# Patient Record
Sex: Female | Born: 1970 | Race: White | Hispanic: No | Marital: Married | Smoking: Never smoker
Health system: Southern US, Community
[De-identification: ages and names within clinical notes are randomized; demographics above are authoritative.]

## PROBLEM LIST (undated history)

## (undated) DIAGNOSIS — G5600 Carpal tunnel syndrome, unspecified upper limb: Secondary | ICD-10-CM

## (undated) DIAGNOSIS — N809 Endometriosis, unspecified: Secondary | ICD-10-CM

## (undated) DIAGNOSIS — K219 Gastro-esophageal reflux disease without esophagitis: Secondary | ICD-10-CM

## (undated) DIAGNOSIS — K819 Cholecystitis, unspecified: Secondary | ICD-10-CM

## (undated) HISTORY — DX: Cholecystitis, unspecified: K81.9

## (undated) HISTORY — DX: Carpal tunnel syndrome, unspecified upper limb: G56.00

## (undated) HISTORY — PX: TUBAL LIGATION: SHX77

## (undated) HISTORY — DX: Endometriosis, unspecified: N80.9

## (undated) HISTORY — PX: DIAGNOSTIC LAPAROSCOPY: SUR761

---

## 2004-09-17 ENCOUNTER — Emergency Department: Payer: Self-pay | Admitting: Emergency Medicine

## 2004-09-18 ENCOUNTER — Ambulatory Visit: Payer: Self-pay | Admitting: Emergency Medicine

## 2005-05-13 ENCOUNTER — Ambulatory Visit: Payer: Self-pay

## 2008-03-30 ENCOUNTER — Ambulatory Visit: Payer: Self-pay

## 2009-06-01 ENCOUNTER — Ambulatory Visit: Payer: Self-pay

## 2009-10-12 ENCOUNTER — Ambulatory Visit: Payer: Self-pay | Admitting: Internal Medicine

## 2009-10-19 ENCOUNTER — Ambulatory Visit: Payer: Self-pay | Admitting: Internal Medicine

## 2010-06-07 ENCOUNTER — Ambulatory Visit: Payer: Self-pay

## 2012-10-12 ENCOUNTER — Ambulatory Visit: Payer: Self-pay | Admitting: Otolaryngology

## 2012-12-16 ENCOUNTER — Ambulatory Visit: Payer: Self-pay | Admitting: Otolaryngology

## 2014-05-05 NOTE — Op Note (Signed)
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   PROCEDURE:  1.  Septoplasty.  2.  Bilateral submucous resection of the inferior turbinates.   SURGEON: Janalee Dane, MD.  ANESTHESIA: General.  FINDINGS: The septum was buckled to the left side of reverse J type deformity extending posteriorly to the junction between the perpendicular plate of the ethmoid and the vomer.  DESCRIPTION OF PROCEDURE: The patient was identified in the holding area and was brought back to the operating room in the supine position on the operating room table.  After general endotracheal anesthesia had been induced the patient was turned 90 degrees counter clockwise from anesthesia.  The nose was anesthetized with infraorbital nerve blocks and septal injection with 0.5% Lidocaine and 0.25% Bupivacaine mixed with 1:150,000 with Epinephrine and phenylephrine Lidocaine soaked pledgets, two on each side were placed and the face was prepped and draped in the usual fashion.  The pledgets were removed.  A 15 blade was used to make a left-sided hemitransfixion incision and septal mucoperichondrial mucoperiosteal leaflets elevated.  There was a large inferior spur that was resected with Jansen-Middleton forceps.  The remaining septum was deviated back and forth in an accordion like fashion.  The bony cartilaginous junction was then divided and a moderate amount of vomer and perpendicular plate was taken down with Jansen-Middleton forceps, releasing the tension on the remaining septum.  The septum then swung back into the midline.  The septal leaflets were closed with quilting 4-0 chromic suture.  The left sided hemitransfixion incision was  closed with 4-0 plain gut.  Attention was directed to the turbinates which had been previously injected on the left.  The head of the inferior turbinate on the left was incised with a 15 blade and the medial mucoperiosteum was elevated using a Psychologist, educational.  Once this had been elevated Knight scissors were used to resect the conchal bone and lateral mucoperiosteum.  The inferior margin of the remaining mucoperiosteum was then cauterized with suction cautery and Surgiflo was placed at the inferior to the inferior margin of the remaining inferior turbinate.  An identical procedure was performed on the right inferior turbinate with once again placement of Surgiflo along its inferior margin.  A total of 1 unit of Surgiflo was placed. Temporary Telfa pledgets were then placed.  The patient was allowed to emerge from anesthesia, extubated in the operating room and taken to the recovery room in stable condition.  There were no complications.  Estimated blood loss was less than 10 mL.   ____________________________ J. Nadeen Landau, MD jmc:aw D: 12/16/2012 10:10:19 ET T: 12/16/2012 10:31:32 ET JOB#: [id number]  cc: Janalee Dane, MD, <Dictator> Nicholos Johns MD ELECTRONICALLY SIGNED 12/17/2012 13:42

## 2014-10-03 DIAGNOSIS — G5602 Carpal tunnel syndrome, left upper limb: Secondary | ICD-10-CM | POA: Insufficient documentation

## 2015-05-30 ENCOUNTER — Other Ambulatory Visit: Payer: Self-pay | Admitting: Obstetrics and Gynecology

## 2015-05-30 DIAGNOSIS — Z1231 Encounter for screening mammogram for malignant neoplasm of breast: Secondary | ICD-10-CM

## 2015-07-05 ENCOUNTER — Ambulatory Visit
Admission: RE | Admit: 2015-07-05 | Discharge: 2015-07-05 | Disposition: A | Discharge status: Home / Self Care | Payer: BLUE CROSS/BLUE SHIELD | Source: Ambulatory Visit | Attending: Obstetrics and Gynecology | Admitting: Obstetrics and Gynecology

## 2015-07-05 ENCOUNTER — Other Ambulatory Visit: Payer: Self-pay | Admitting: Obstetrics and Gynecology

## 2015-07-05 DIAGNOSIS — Z1231 Encounter for screening mammogram for malignant neoplasm of breast: Secondary | ICD-10-CM | POA: Diagnosis not present

## 2015-07-06 DIAGNOSIS — Z01419 Encounter for gynecological examination (general) (routine) without abnormal findings: Secondary | ICD-10-CM | POA: Diagnosis not present

## 2015-12-12 DIAGNOSIS — J019 Acute sinusitis, unspecified: Secondary | ICD-10-CM | POA: Diagnosis not present

## 2016-02-27 ENCOUNTER — Observation Stay: Payer: BLUE CROSS/BLUE SHIELD | Admitting: Anesthesiology

## 2016-02-27 ENCOUNTER — Observation Stay
Admission: EM | Admit: 2016-02-27 | Discharge: 2016-02-28 | Disposition: A | Discharge status: Home / Self Care | Payer: BLUE CROSS/BLUE SHIELD | Attending: Surgery | Admitting: Surgery

## 2016-02-27 ENCOUNTER — Emergency Department: Payer: BLUE CROSS/BLUE SHIELD

## 2016-02-27 ENCOUNTER — Encounter
Admission: EM | Disposition: A | Discharge status: Home / Self Care | Payer: Self-pay | Source: Home / Self Care | Attending: Emergency Medicine

## 2016-02-27 DIAGNOSIS — R109 Unspecified abdominal pain: Secondary | ICD-10-CM

## 2016-02-27 DIAGNOSIS — K824 Cholesterolosis of gallbladder: Secondary | ICD-10-CM | POA: Diagnosis not present

## 2016-02-27 DIAGNOSIS — K8010 Calculus of gallbladder with chronic cholecystitis without obstruction: Principal | ICD-10-CM | POA: Insufficient documentation

## 2016-02-27 DIAGNOSIS — K819 Cholecystitis, unspecified: Secondary | ICD-10-CM | POA: Diagnosis not present

## 2016-02-27 DIAGNOSIS — K8020 Calculus of gallbladder without cholecystitis without obstruction: Secondary | ICD-10-CM | POA: Diagnosis not present

## 2016-02-27 DIAGNOSIS — R05 Cough: Secondary | ICD-10-CM | POA: Diagnosis not present

## 2016-02-27 DIAGNOSIS — R079 Chest pain, unspecified: Secondary | ICD-10-CM | POA: Diagnosis not present

## 2016-02-27 DIAGNOSIS — K810 Acute cholecystitis: Secondary | ICD-10-CM | POA: Diagnosis not present

## 2016-02-27 DIAGNOSIS — R1011 Right upper quadrant pain: Secondary | ICD-10-CM | POA: Diagnosis not present

## 2016-02-27 HISTORY — PX: CHOLECYSTECTOMY: SHX55

## 2016-02-27 LAB — COMPREHENSIVE METABOLIC PANEL
ALBUMIN: 4.1 g/dL (ref 3.5–5.0)
ALT: 11 U/L — AB (ref 14–54)
AST: 18 U/L (ref 15–41)
Alkaline Phosphatase: 69 U/L (ref 38–126)
Anion gap: 8 (ref 5–15)
BUN: 10 mg/dL (ref 6–20)
CHLORIDE: 112 mmol/L — AB (ref 101–111)
CO2: 21 mmol/L — AB (ref 22–32)
CREATININE: 1.25 mg/dL — AB (ref 0.44–1.00)
Calcium: 8.5 mg/dL — ABNORMAL LOW (ref 8.9–10.3)
GFR calc Af Amer: 58 mL/min — ABNORMAL LOW (ref >60)
GFR, EST NON AFRICAN AMERICAN: 50 mL/min — AB (ref >60)
Glucose, Bld: 123 mg/dL — ABNORMAL HIGH (ref 65–99)
POTASSIUM: 4.1 mmol/L (ref 3.5–5.1)
SODIUM: 141 mmol/L (ref 135–145)
Total Bilirubin: 0.4 mg/dL (ref 0.3–1.2)
Total Protein: 7.3 g/dL (ref 6.5–8.1)

## 2016-02-27 LAB — URINALYSIS, COMPLETE (UACMP) WITH MICROSCOPIC
BILIRUBIN URINE: NEGATIVE
Bacteria, UA: NONE SEEN
GLUCOSE, UA: NEGATIVE mg/dL
KETONES UR: 5 mg/dL — AB
LEUKOCYTES UA: NEGATIVE
Nitrite: NEGATIVE
PH: 5 (ref 5.0–8.0)
PROTEIN: NEGATIVE mg/dL
Specific Gravity, Urine: 1.023 (ref 1.005–1.030)

## 2016-02-27 LAB — CBC
HCT: 37.8 % (ref 35.0–47.0)
Hemoglobin: 13.1 g/dL (ref 12.0–16.0)
MCH: 32.5 pg (ref 26.0–34.0)
MCHC: 34.6 g/dL (ref 32.0–36.0)
MCV: 93.8 fL (ref 80.0–100.0)
PLATELETS: 195 10*3/uL (ref 150–440)
RBC: 4.03 MIL/uL (ref 3.80–5.20)
RDW: 13 % (ref 11.5–14.5)
WBC: 10.7 10*3/uL (ref 3.6–11.0)

## 2016-02-27 LAB — TROPONIN I

## 2016-02-27 LAB — POCT PREGNANCY, URINE: PREG TEST UR: NEGATIVE

## 2016-02-27 LAB — LIPASE, BLOOD: Lipase: 30 U/L (ref 11–51)

## 2016-02-27 LAB — BILIRUBIN, DIRECT: Bilirubin, Direct: <0.1 mg/dL — ABNORMAL LOW (ref 0.1–0.5)

## 2016-02-27 SURGERY — LAPAROSCOPIC CHOLECYSTECTOMY
Anesthesia: General | Site: Abdomen | Wound class: Contaminated

## 2016-02-27 MED ORDER — MIDAZOLAM HCL 2 MG/2ML IJ SOLN
INTRAMUSCULAR | Status: DC | PRN
  Administered 2016-02-27: 2 mg via INTRAVENOUS

## 2016-02-27 MED ORDER — ONDANSETRON HCL 4 MG/2ML IJ SOLN
INTRAMUSCULAR | Status: AC
  Filled 2016-02-27: qty 2

## 2016-02-27 MED ORDER — PROPOFOL 10 MG/ML IV BOLUS
INTRAVENOUS | Status: AC
  Filled 2016-02-27: qty 20

## 2016-02-27 MED ORDER — OXYCODONE HCL 5 MG/5ML PO SOLN: 5.0000 mg | Freq: Once | ORAL | Status: DC | PRN

## 2016-02-27 MED ORDER — ENOXAPARIN SODIUM 40 MG/0.4ML ~~LOC~~ SOLN
40.0000 mg | SUBCUTANEOUS | Status: DC
  Administered 2016-02-28: 40 mg via SUBCUTANEOUS
  Filled 2016-02-27: qty 0.4

## 2016-02-27 MED ORDER — DEXTROSE 5 % IV SOLN: 1.0000 g | Freq: Once | INTRAVENOUS | Status: DC

## 2016-02-27 MED ORDER — OXYCODONE HCL 5 MG PO TABS: 5.0000 mg | ORAL_TABLET | Freq: Once | ORAL | Status: DC | PRN

## 2016-02-27 MED ORDER — LIDOCAINE HCL (PF) 2 % IJ SOLN
INTRAMUSCULAR | Status: AC
  Filled 2016-02-27: qty 2

## 2016-02-27 MED ORDER — MIDAZOLAM HCL 2 MG/2ML IJ SOLN
INTRAMUSCULAR | Status: AC
  Filled 2016-02-27: qty 2

## 2016-02-27 MED ORDER — PROPOFOL 10 MG/ML IV BOLUS
INTRAVENOUS | Status: DC | PRN
  Administered 2016-02-27: 160 mg via INTRAVENOUS

## 2016-02-27 MED ORDER — ONDANSETRON 4 MG PO TBDP: 4.0000 mg | ORAL_TABLET | Freq: Four times a day (QID) | ORAL | Status: DC | PRN

## 2016-02-27 MED ORDER — SODIUM CHLORIDE 0.9 % IV BOLUS (SEPSIS)
1000.0000 mL | Freq: Once | INTRAVENOUS | Status: AC
  Administered 2016-02-27: 1000 mL via INTRAVENOUS

## 2016-02-27 MED ORDER — HYDROMORPHONE HCL 1 MG/ML IJ SOLN: 0.5000 mg | INTRAMUSCULAR | Status: DC | PRN

## 2016-02-27 MED ORDER — OXYCODONE-ACETAMINOPHEN 7.5-325 MG PO TABS
2.0000 | ORAL_TABLET | ORAL | Status: DC | PRN
Start: 2016-02-27 — End: 2016-02-28
  Administered 2016-02-27 – 2016-02-28 (×2): 2 via ORAL
  Filled 2016-02-27 (×2): qty 2

## 2016-02-27 MED ORDER — FENTANYL CITRATE (PF) 100 MCG/2ML IJ SOLN
INTRAMUSCULAR | Status: AC
  Administered 2016-02-27: 50 ug via INTRAVENOUS
  Filled 2016-02-27: qty 2

## 2016-02-27 MED ORDER — ROCURONIUM BROMIDE 50 MG/5ML IV SOLN
INTRAVENOUS | Status: AC
  Filled 2016-02-27: qty 1

## 2016-02-27 MED ORDER — ONDANSETRON HCL 4 MG/2ML IJ SOLN
4.0000 mg | Freq: Once | INTRAMUSCULAR | Status: AC
  Administered 2016-02-27: 4 mg via INTRAVENOUS
  Filled 2016-02-27: qty 2

## 2016-02-27 MED ORDER — FENTANYL CITRATE (PF) 100 MCG/2ML IJ SOLN
INTRAMUSCULAR | Status: DC | PRN
  Administered 2016-02-27 (×4): 25 ug via INTRAVENOUS

## 2016-02-27 MED ORDER — CEFTRIAXONE SODIUM-DEXTROSE 1-3.74 GM-% IV SOLR
1.0000 g | Freq: Once | INTRAVENOUS | Status: AC
  Administered 2016-02-27: 1 g via INTRAVENOUS
  Filled 2016-02-27: qty 50

## 2016-02-27 MED ORDER — FENTANYL CITRATE (PF) 100 MCG/2ML IJ SOLN
INTRAMUSCULAR | Status: AC
  Filled 2016-02-27: qty 2

## 2016-02-27 MED ORDER — PIPERACILLIN-TAZOBACTAM 3.375 G IVPB
3.3750 g | Freq: Three times a day (TID) | INTRAVENOUS | Status: DC
  Administered 2016-02-27 – 2016-02-28 (×3): 3.375 g via INTRAVENOUS
  Filled 2016-02-27 (×2): qty 50

## 2016-02-27 MED ORDER — FENTANYL CITRATE (PF) 100 MCG/2ML IJ SOLN
25.0000 ug | INTRAMUSCULAR | Status: DC | PRN
  Administered 2016-02-27: 50 ug via INTRAVENOUS

## 2016-02-27 MED ORDER — ACETAMINOPHEN 10 MG/ML IV SOLN
INTRAVENOUS | Status: DC | PRN
  Administered 2016-02-27: 1000 mg via INTRAVENOUS

## 2016-02-27 MED ORDER — SUGAMMADEX SODIUM 200 MG/2ML IV SOLN
INTRAVENOUS | Status: AC
  Filled 2016-02-27: qty 2

## 2016-02-27 MED ORDER — ONDANSETRON HCL 4 MG/2ML IJ SOLN
INTRAMUSCULAR | Status: DC | PRN
  Administered 2016-02-27: 4 mg via INTRAVENOUS

## 2016-02-27 MED ORDER — PROMETHAZINE HCL 25 MG/ML IJ SOLN: 6.2500 mg | INTRAMUSCULAR | Status: DC | PRN

## 2016-02-27 MED ORDER — BUPIVACAINE-EPINEPHRINE 0.25% -1:200000 IJ SOLN
INTRAMUSCULAR | Status: DC | PRN
  Administered 2016-02-27: 30 mL

## 2016-02-27 MED ORDER — MORPHINE SULFATE (PF) 4 MG/ML IV SOLN
4.0000 mg | Freq: Once | INTRAVENOUS | Status: AC
  Administered 2016-02-27: 4 mg via INTRAVENOUS
  Filled 2016-02-27: qty 1

## 2016-02-27 MED ORDER — MEPERIDINE HCL 25 MG/ML IJ SOLN
25.0000 mg | Freq: Once | INTRAMUSCULAR | Status: AC
  Administered 2016-02-27: 25 mg via INTRAVENOUS

## 2016-02-27 MED ORDER — LIDOCAINE HCL (CARDIAC) 20 MG/ML IV SOLN
INTRAVENOUS | Status: DC | PRN
  Administered 2016-02-27: 60 mg via INTRAVENOUS

## 2016-02-27 MED ORDER — MEPERIDINE HCL 25 MG/ML IJ SOLN
INTRAMUSCULAR | Status: AC
  Filled 2016-02-27: qty 1

## 2016-02-27 MED ORDER — MEPERIDINE HCL 25 MG/ML IJ SOLN: 6.2500 mg | INTRAMUSCULAR | Status: DC | PRN

## 2016-02-27 MED ORDER — LACTATED RINGERS IV SOLN
INTRAVENOUS | Status: DC
  Administered 2016-02-27 – 2016-02-28 (×3): via INTRAVENOUS

## 2016-02-27 MED ORDER — BUPIVACAINE-EPINEPHRINE (PF) 0.25% -1:200000 IJ SOLN
INTRAMUSCULAR | Status: AC
  Filled 2016-02-27: qty 30

## 2016-02-27 MED ORDER — SUGAMMADEX SODIUM 200 MG/2ML IV SOLN
INTRAVENOUS | Status: DC | PRN
  Administered 2016-02-27: 200 mg via INTRAVENOUS

## 2016-02-27 MED ORDER — PIPERACILLIN-TAZOBACTAM 3.375 G IVPB
INTRAVENOUS | Status: AC
  Filled 2016-02-27: qty 50

## 2016-02-27 MED ORDER — ROCURONIUM BROMIDE 100 MG/10ML IV SOLN
INTRAVENOUS | Status: DC | PRN
  Administered 2016-02-27: 40 mg via INTRAVENOUS

## 2016-02-27 MED ORDER — ONDANSETRON HCL 4 MG/2ML IJ SOLN: 4.0000 mg | Freq: Four times a day (QID) | INTRAMUSCULAR | Status: DC | PRN

## 2016-02-27 MED ORDER — KETOROLAC TROMETHAMINE 30 MG/ML IJ SOLN
30.0000 mg | Freq: Four times a day (QID) | INTRAMUSCULAR | Status: DC
  Administered 2016-02-27 – 2016-02-28 (×4): 30 mg via INTRAVENOUS
  Filled 2016-02-27 (×4): qty 1

## 2016-02-27 MED ORDER — ACETAMINOPHEN 10 MG/ML IV SOLN
INTRAVENOUS | Status: AC
  Filled 2016-02-27: qty 100

## 2016-02-27 MED ORDER — KETOROLAC TROMETHAMINE 30 MG/ML IJ SOLN: 30.0000 mg | Freq: Four times a day (QID) | INTRAMUSCULAR | Status: DC | PRN

## 2016-02-27 SURGICAL SUPPLY — 46 items
APPLICATOR COTTON TIP 6IN STRL (MISCELLANEOUS) ×2 IMPLANT
APPLIER CLIP 5 13 M/L LIGAMAX5 (MISCELLANEOUS) ×2
BLADE SURG 15 STRL LF DISP TIS (BLADE) ×1 IMPLANT
BLADE SURG 15 STRL SS (BLADE) ×1
CANISTER SUCT 1200ML W/VALVE (MISCELLANEOUS) ×2 IMPLANT
CHLORAPREP W/TINT 26ML (MISCELLANEOUS) ×2 IMPLANT
CHOLANGIOGRAM CATH TAUT (CATHETERS) IMPLANT
CLEANER CAUTERY TIP 5X5 PAD (MISCELLANEOUS) ×1 IMPLANT
CLIP APPLIE 5 13 M/L LIGAMAX5 (MISCELLANEOUS) ×1 IMPLANT
DECANTER SPIKE VIAL GLASS SM (MISCELLANEOUS) ×4 IMPLANT
DEVICE TROCAR PUNCTURE CLOSURE (ENDOMECHANICALS) IMPLANT
DRAPE C-ARM XRAY 36X54 (DRAPES) ×2 IMPLANT
ELECT CAUTERY BLADE 6.4 (BLADE) ×2 IMPLANT
ELECT REM PT RETURN 9FT ADLT (ELECTROSURGICAL) ×2
ELECTRODE REM PT RTRN 9FT ADLT (ELECTROSURGICAL) ×1 IMPLANT
ENDOPOUCH RETRIEVER 10 (MISCELLANEOUS) ×2 IMPLANT
GLOVE BIO SURGEON STRL SZ7 (GLOVE) ×2 IMPLANT
GOWN STRL REUS W/ TWL LRG LVL3 (GOWN DISPOSABLE) ×3 IMPLANT
GOWN STRL REUS W/TWL LRG LVL3 (GOWN DISPOSABLE) ×3
IRRIGATION STRYKERFLOW (MISCELLANEOUS) ×1 IMPLANT
IRRIGATOR STRYKERFLOW (MISCELLANEOUS) ×2
IV CATH ANGIO 12GX3 LT BLUE (NEEDLE) ×2 IMPLANT
IV NS 1000ML (IV SOLUTION) ×1
IV NS 1000ML BAXH (IV SOLUTION) ×1 IMPLANT
L-HOOK LAP DISP 36CM (ELECTROSURGICAL) ×2
LHOOK LAP DISP 36CM (ELECTROSURGICAL) ×1 IMPLANT
LIQUID BAND (GAUZE/BANDAGES/DRESSINGS) ×2 IMPLANT
NEEDLE HYPO 22GX1.5 SAFETY (NEEDLE) ×2 IMPLANT
PACK LAP CHOLECYSTECTOMY (MISCELLANEOUS) ×2 IMPLANT
PAD CLEANER CAUTERY TIP 5X5 (MISCELLANEOUS) ×1
PENCIL ELECTRO HAND CTR (MISCELLANEOUS) ×2 IMPLANT
SCISSORS METZENBAUM CVD 33 (INSTRUMENTS) ×2 IMPLANT
SLEEVE ENDOPATH XCEL 5M (ENDOMECHANICALS) ×4 IMPLANT
SOL ANTI-FOG 6CC FOG-OUT (MISCELLANEOUS) ×1 IMPLANT
SOL FOG-OUT ANTI-FOG 6CC (MISCELLANEOUS) ×1
SPONGE LAP 18X18 5 PK (GAUZE/BANDAGES/DRESSINGS) ×2 IMPLANT
STOPCOCK 3 WAY  REPLAC (MISCELLANEOUS) IMPLANT
SUT ETHIBOND 0 MO6 C/R (SUTURE) IMPLANT
SUT MNCRL AB 4-0 PS2 18 (SUTURE) ×2 IMPLANT
SUT VIC AB 0 CT2 27 (SUTURE) IMPLANT
SUT VICRYL 0 AB UR-6 (SUTURE) ×4 IMPLANT
SYR 20CC LL (SYRINGE) ×2 IMPLANT
TROCAR XCEL BLUNT TIP 100MML (ENDOMECHANICALS) ×2 IMPLANT
TROCAR XCEL NON-BLD 5MMX100MML (ENDOMECHANICALS) ×2 IMPLANT
TUBING INSUFFLATOR HI FLOW (MISCELLANEOUS) ×2 IMPLANT
WATER STERILE IRR 1000ML POUR (IV SOLUTION) ×2 IMPLANT

## 2016-02-27 NOTE — Anesthesia Preprocedure Evaluation (Signed)
Anesthesia Evaluation  Patient identified by MRN, date of birth, ID band Patient awake    Reviewed: Allergy & Precautions, NPO status , Patient's Chart, lab work & pertinent test results  Airway Mallampati: III  TM Distance: >3 FB     Dental  (+) Chipped   Pulmonary neg pulmonary ROS,    Pulmonary exam normal        Cardiovascular negative cardio ROS Normal cardiovascular exam     Neuro/Psych negative neurological ROS  negative psych ROS   GI/Hepatic Neg liver ROS,   Endo/Other  negative endocrine ROS  Renal/GU negative Renal ROS  negative genitourinary   Musculoskeletal negative musculoskeletal ROS (+)   Abdominal   Peds negative pediatric ROS (+)  Hematology negative hematology ROS (+)   Anesthesia Other Findings History reviewed. No pertinent past medical history.  Acute cholecystitis  Reproductive/Obstetrics negative OB ROS                            Anesthesia Physical Anesthesia Plan  ASA: II  Anesthesia Plan: General   Post-op Pain Management:    Induction: Intravenous  Airway Management Planned: Oral ETT  Additional Equipment:   Intra-op Plan:   Post-operative Plan: Extubation in OR  Informed Consent: I have reviewed the patients History and Physical, chart, labs and discussed the procedure including the risks, benefits and alternatives for the proposed anesthesia with the patient or authorized representative who has indicated his/her understanding and acceptance.   Dental advisory given  Plan Discussed with: CRNA and Surgeon  Anesthesia Plan Comments:         Anesthesia Quick Evaluation

## 2016-02-27 NOTE — Anesthesia Post-op Follow-up Note (Cosign Needed)
Anesthesia QCDR form completed.        

## 2016-02-27 NOTE — Anesthesia Postprocedure Evaluation (Signed)
Anesthesia Post Note  Patient: Tracey Watkins  Procedure(s) Performed: Procedure(s) (LRB): LAPAROSCOPIC CHOLECYSTECTOMY (N/A)  Patient location during evaluation: PACU Anesthesia Type: General Level of consciousness: awake and alert and oriented Pain management: pain level controlled Vital Signs Assessment: post-procedure vital signs reviewed and stable Respiratory status: spontaneous breathing, nonlabored ventilation and respiratory function stable Cardiovascular status: blood pressure returned to baseline and stable Postop Assessment: no signs of nausea or vomiting Anesthetic complications: no     Last Vitals:  Vitals:   02/27/16 1740 02/27/16 1847  BP: 118/74 116/62  Pulse: (!) 55 63  Resp: 16 14  Temp: 36.8 C 36.6 C    Last Pain:  Vitals:   02/27/16 1847  TempSrc: Oral  PainSc:                  Kaspar Albornoz

## 2016-02-27 NOTE — Transfer of Care (Signed)
Immediate Anesthesia Transfer of Care Note  Patient: Tracey Watkins  Procedure(s) Performed: Procedure(s): LAPAROSCOPIC CHOLECYSTECTOMY (N/A)  Patient Location: PACU  Anesthesia Type:General  Level of Consciousness: sedated  Airway & Oxygen Therapy: Patient Spontanous Breathing and Patient connected to face mask oxygen  Post-op Assessment: Report given to RN and Post -op Vital signs reviewed and stable  Post vital signs: Reviewed and stable  Last Vitals:  Vitals:   02/27/16 1340 02/27/16 1605  BP: (!) 108/93 130/80  Pulse: 60 (!) 102  Resp: 16 15  Temp: 37.1 C [id number] C    Complications: No apparent anesthesia complications

## 2016-02-27 NOTE — Progress Notes (Signed)
Patient ID: Tracey Watkins, female   DOB: October 14, 1970, 46 y.o.   MRN: [id number]  HPI Tracey Watkins is a 46 y.o. female presented with RUQ and Chest pain. Her pain is sharp severe and woke her from her sleep, it does radiate to her back and chest. She thought she was having an MI. Associated Emesis . She stated that her first sxs started on Monday night with only one episode of vomiting. Had a normal day yesterday until early this am. No fevers or chills. No evidence of biliary obstruction. W/U included RUQ U/S revealing thickening GB w stones. Nml CBD. Nml LFTs  HPI  History reviewed. No pertinent past medical history.  Past Surgical History:  Procedure Laterality Date  . TUBAL LIGATION      Family History  Problem Relation Age of Onset  . Breast cancer Paternal Grandmother 60    Social History Social History  Substance Use Topics  . Smoking status: Never Smoker  . Smokeless tobacco: Never Used  . Alcohol use No    No Known Allergies  Current Facility-Administered Medications  Medication Dose Route Frequency Provider Last Rate Last Dose  . [START ON 02/28/2016] enoxaparin (LOVENOX) injection 40 mg  40 mg Subcutaneous Q24H Diego F Pabon, MD      . HYDROmorphone (DILAUDID) injection 0.5 mg  0.5 mg Intravenous Q2H PRN Diego F Pabon, MD      . ketorolac (TORADOL) 30 MG/ML injection 30 mg  30 mg Intravenous Q6H Diego Sarita Haver, MD       Followed by  . [START ON 03/03/2016] ketorolac (TORADOL) 30 MG/ML injection 30 mg  30 mg Intravenous Q6H PRN Diego F Pabon, MD      . lactated ringers infusion   Intravenous Continuous Diego F Pabon, MD      . ondansetron (ZOFRAN-ODT) disintegrating tablet 4 mg  4 mg Oral Q6H PRN Diego F Pabon, MD       Or  . ondansetron (ZOFRAN) injection 4 mg  4 mg Intravenous Q6H PRN Diego F Pabon, MD      . piperacillin-tazobactam (ZOSYN) IVPB 3.375 g  3.375 g Intravenous Q8H Diego Sarita Haver, MD       Current Outpatient Prescriptions  Medication Sig Dispense Refill   . norethindrone (AYGESTIN) 5 MG tablet Take 5 mg by mouth daily.    . ranitidine (ZANTAC) 150 MG tablet Take 150 mg by mouth 2 (two) times daily.       Review of Systems A 10 point review of systems was asked and was negative except for the information on the HPI  Physical Exam Blood pressure 117/77, pulse 81, temperature 98.4 F (36.9 C), temperature source Oral, resp. rate 17, height 5\' 7"  (1.702 m), weight 89.4 kg (197 lb), SpO2 99 %. CONSTITUTIONAL: NAD EYES: Pupils are equal, round, and reactive to light, Sclera are non-icteric. EARS, NOSE, MOUTH AND THROAT: The oropharynx is clear. The oral mucosa is pink and moist. Hearing is intact to voice. LYMPH NODES:  Lymph nodes in the neck are normal. RESPIRATORY:  Lungs are clear. There is normal respiratory effort, with equal breath sounds bilaterally, and without pathologic use of accessory muscles. CARDIOVASCULAR: Heart is regular without murmurs, gallops, or rubs. GI: The abdomen is  Soft + murphy sign. Non palpable GB. GU: Rectal deferred.   MUSCULOSKELETAL: Normal muscle strength and tone. No cyanosis or edema.   SKIN: Turgor is good and there are no pathologic skin lesions or ulcers. NEUROLOGIC: Motor and sensation  is grossly normal. Cranial nerves are grossly intact. PSYCH:  Oriented to person, place and time. Affect is normal.  Data Reviewed   Assessment/Plan Acute cholecystitis in need for cholecystectomy. We will admit her, start crystalloids and antibiotics and perform chole later today.  I discussed the procedure in detail.   We discussed the risks and benefits of a laparoscopic cholecystectomy and possible cholangiogram including, but not limited to bleeding, infection, injury to surrounding structures such as the intestine or liver, bile leak, retained gallstones, need to convert to an open procedure, prolonged diarrhea, blood clots such as  DVT, common bile duct injury, anesthesia risks, and possible need for additional  procedures.  The likelihood of improvement in symptoms and return to the patient's normal status is good. We discussed the typical post-operative recovery course.   Caroleen Hamman, MD FACS General Surgeon 02/27/2016, 12:36 PM

## 2016-02-27 NOTE — H&P (Signed)
HPI Tracey Watkins is a 46 y.o. female presented with RUQ and Chest pain. Her pain is sharp severe and woke her from her sleep, it does radiate to her back and chest. She thought she was having an MI. Associated Emesis . She stated that her first sxs started on Monday night with only one episode of vomiting. Had a normal day yesterday until early this am. No fevers or chills. No evidence of biliary obstruction. W/U included RUQ U/S revealing thickening GB w stones. Nml CBD. Nml LFTs  HPI  History reviewed. No pertinent past medical history.       Past Surgical History:  Procedure Laterality Date  . TUBAL LIGATION           Family History  Problem Relation Age of Onset  . Breast cancer Paternal Grandmother 67    Social History     Social History  Substance Use Topics  . Smoking status: Never Smoker  . Smokeless tobacco: Never Used  . Alcohol use No    No Known Allergies           Current Facility-Administered Medications  Medication Dose Route Frequency Provider Last Rate Last Dose  . [START ON 02/28/2016] enoxaparin (LOVENOX) injection 40 mg  40 mg Subcutaneous Q24H Mauricio Dahlen F Lyndall Bellot, MD      . HYDROmorphone (DILAUDID) injection 0.5 mg  0.5 mg Intravenous Q2H PRN Kyndall Amero F Bryann Mcnealy, MD      . ketorolac (TORADOL) 30 MG/ML injection 30 mg  30 mg Intravenous Q6H [place]ilene Mcphee Sarita Haver, MD       Followed by  . [START ON 03/03/2016] ketorolac (TORADOL) 30 MG/ML injection 30 mg  30 mg Intravenous Q6H PRN Elton Heid F Timonthy Hovater, MD      . lactated ringers infusion   Intravenous Continuous Kandice Schmelter F Clemie General, MD      . ondansetron (ZOFRAN-ODT) disintegrating tablet 4 mg  4 mg Oral Q6H PRN Linwood Gullikson F Jsaon Yoo, MD       Or  . ondansetron (ZOFRAN) injection 4 mg  4 mg Intravenous Q6H PRN Farren Nelles F Ardene Remley, MD      . piperacillin-tazobactam (ZOSYN) IVPB 3.375 g  3.375 g Intravenous Q8H Delinda Malan Sarita Haver, MD             Current Outpatient Prescriptions  Medication Sig Dispense Refill  . norethindrone (AYGESTIN) 5  MG tablet Take 5 mg by mouth daily.    . ranitidine (ZANTAC) 150 MG tablet Take 150 mg by mouth 2 (two) times daily.       Review of Systems A 10 point review of systems was asked and was negative except for the information on the HPI  Physical Exam Blood pressure 117/77, pulse 81, temperature 98.4 F (36.9 C), temperature source Oral, resp. rate 17, height 5\' 7"  (1.702 m), weight 89.4 kg (197 lb), SpO2 99 %. CONSTITUTIONAL: NAD EYES: Pupils are equal, round, and reactive to light, Sclera are non-icteric. EARS, NOSE, MOUTH AND THROAT: The oropharynx is clear. The oral mucosa is pink and moist. Hearing is intact to voice. LYMPH NODES:  Lymph nodes in the neck are normal. RESPIRATORY:  Lungs are clear. There is normal respiratory effort, with equal breath sounds bilaterally, and without pathologic use of accessory muscles. CARDIOVASCULAR: Heart is regular without murmurs, gallops, or rubs. GI: The abdomen is  Soft + murphy sign. Non palpable GB. GU: Rectal deferred.   MUSCULOSKELETAL: Normal muscle strength and tone. No cyanosis or edema.   SKIN: Turgor is good and  there are no pathologic skin lesions or ulcers. NEUROLOGIC: Motor and sensation is grossly normal. Cranial nerves are grossly intact. PSYCH:  Oriented to person, place and time. Affect is normal.  Data Reviewed   Assessment/Plan Acute cholecystitis in need for cholecystectomy. We will admit her, start crystalloids and antibiotics and perform chole later today.  I discussed the procedure in detail.   We discussed the risks and benefits of a laparoscopic cholecystectomy and possible cholangiogram including, but not limited to bleeding, infection, injury to surrounding structures such as the intestine or liver, bile leak, retained gallstones, need to convert to an open procedure, prolonged diarrhea, blood clots such as  DVT, common bile duct injury, anesthesia risks, and possible need for additional procedures.  The  likelihood of improvement in symptoms and return to the patient's normal status is good. We discussed the typical post-operative recovery course.   Caroleen Hamman, MD FACS

## 2016-02-27 NOTE — ED Notes (Signed)
Attempted to call report to floor, RN unavailable at this time.  ASCOM # given and Rn will return my call.

## 2016-02-27 NOTE — OR Nursing (Signed)
Zosyn to OR with pt =- will start in OR per Charlyne Quale, RN

## 2016-02-27 NOTE — ED Notes (Signed)
Patient assisted to the restroom with no difficulty.  Steady ambulation at this time.

## 2016-02-27 NOTE — Op Note (Signed)
Laparoscopic Cholecystectomy  Pre-operative Diagnosis: Acute cholecystitis  Post-operative Diagnosis: same  Procedure: Lap cholecystectomy  Surgeon: Caroleen Hamman, MD FACS  Anesthesia: Gen. with endotracheal tube  Findings: Acute Cholecystitis  Friable liver bed due to inflammation  Estimated Blood Loss: 75cc                Specimens: Gallbladder           Complications: none   Procedure Details  The patient was seen again in the Holding Room. The benefits, complications, treatment options, and expected outcomes were discussed with the patient. The risks of bleeding, infection, recurrence of symptoms, failure to resolve symptoms, bile duct damage, bile duct leak, retained common bile duct stone, bowel injury, any of which could require further surgery and/or ERCP, stent, or papillotomy were reviewed with the patient. The likelihood of improving the patient's symptoms with return to their baseline status is good.  The patient and/or family concurred with the proposed plan, giving informed consent.  The patient was taken to Operating Room, identified as Tracey Watkins and the procedure verified as Laparoscopic Cholecystectomy.  A Time Out was held and the above information confirmed.  Prior to the induction of general anesthesia, antibiotic prophylaxis was administered. VTE prophylaxis was in place. General endotracheal anesthesia was then administered and tolerated well. After the induction, the abdomen was prepped with Chloraprep and draped in the sterile fashion. The patient was positioned in the supine position.  Local anesthetic  was injected into the skin near the umbilicus and an incision made. Cut down technique was used to enter the abdominal cavity and a Hasson trochar was placed after two vicryl stitches were anchored to the fascia. Pneumoperitoneum was then created with CO2 and tolerated well without any adverse changes in the patient's vital signs.  Three 5-mm ports were placed  in the right upper quadrant all under direct vision. All skin incisions  were infiltrated with a local anesthetic agent before making the incision and placing the trocars.   The patient was positioned  in reverse Trendelenburg, tilted slightly to the patient's left.  The gallbladder was identified, the fundus grasped and retracted cephalad. Adhesions were lysed bluntly. The infundibulum was grasped and retracted laterally, exposing the peritoneum overlying the triangle of Calot. This was then divided and exposed in a blunt fashion. An extended critical view of the cystic duct and cystic artery was obtained.  The cystic duct was clearly identified and bluntly dissected.   Artery and duct were double clipped and divided.  The gallbladder was taken from the gallbladder fossa in a retrograde fashion with the electrocautery. The gallbladder was removed and placed in an Endocatch bag. The liver bed was irrigated and inspected. Hemostasis was achieved with the electrocautery, liver bed was friable due to inflammation. Copious irrigation was utilized and was repeatedly aspirated until clear.  The gallbladder and Endocatch sac were then removed through the epigastric port site.   Inspection of the right upper quadrant was performed. No bleeding, bile duct injury or leak, or bowel injury was noted. Pneumoperitoneum was released.  The periumbilical port site was closed with figure-of-eight 0 Vicryl sutures. 4-0 subcuticular Monocryl was used to close the skin. Dermabond was  applied.  The patient was then extubated and brought to the recovery room in stable condition. Sponge, lap, and needle counts were correct at closure and at the conclusion of the case.               Caroleen Hamman, MD,  FACS

## 2016-02-27 NOTE — ED Provider Notes (Signed)
[facility] Emergency Department Provider Note   ____________________________________________   First MD Initiated Contact with Patient 02/27/16 0430     (approximate)  I have reviewed the triage vital signs and the nursing notes.   HISTORY  Chief Complaint Chest Pain    HPI Tracey Watkins is a 46 y.o. female who comes into the hospital today with some chest pain. The patient reports the pain started at 2 AM and woke her up out of sleep. It's right-sided pain underneath her breast. The pain radiates into her shoulder. The patient reports she's had some sweats and dizziness with some nausea and vomiting. She's never had this pain before and didn't take anything for pain. She reports that it does hurt when she's breathes in deeply. The patient rates her pain a 9 or 10 in intensity. She is here today for evaluation of this pain.   History reviewed. No pertinent past medical history.  There are no active problems to display for this patient.   Past Surgical History:  Procedure Laterality Date  . TUBAL LIGATION      Prior to Admission medications   Not on File    Allergies Patient has no known allergies.  Family History  Problem Relation Age of Onset  . Breast cancer Paternal Grandmother 36    Social History Social History  Substance Use Topics  . Smoking status: Never Smoker  . Smokeless tobacco: Never Used  . Alcohol use No    Review of Systems Constitutional: No fever/chills Eyes: No visual changes. ENT: No sore throat. Cardiovascular:  chest pain. Respiratory:  shortness of breath. Gastrointestinal: No abdominal pain.  No nausea, no vomiting.  No diarrhea.  No constipation. Genitourinary: Negative for dysuria. Musculoskeletal: Negative for back pain. Skin: Negative for rash. Neurological: Negative for headaches, focal weakness or numbness.  10-point ROS otherwise  negative.  ____________________________________________   PHYSICAL EXAM:  VITAL SIGNS: ED Triage Vitals  Enc Vitals Group     BP 02/27/16 0530 131/78     Pulse Rate 02/27/16 0530 (!) 50     Resp 02/27/16 0530 15     Temp --      Temp src --      SpO2 02/27/16 0530 97 %     Weight 02/27/16 0415 197 lb (89.4 kg)     Height 02/27/16 0415 5\' 7"  (1.702 m)     Head Circumference --      Peak Flow --      Pain Score 02/27/16 0415 9     Pain Loc --      Pain Edu? --      Excl. in [place]? --     Constitutional: Alert and oriented. Well appearing and in Moderate distress. Eyes: Conjunctivae are normal. PERRL. EOMI. Head: Atraumatic. Nose: No congestion/rhinnorhea. Mouth/Throat: Mucous membranes are moist.  Oropharynx non-erythematous. Cardiovascular: Normal rate, regular rhythm. Grossly normal heart sounds.  Good peripheral circulation. Respiratory: Normal respiratory effort.  No retractions. Lungs CTAB. Gastrointestinal: Soft with some right upper quadrant tenderness to palpation. No distention. Positive bowel sounds Musculoskeletal: No lower extremity tenderness nor edema.   Neurologic:  Normal speech and language. Skin:  Skin is warm, dry and intact. Psychiatric: Mood and affect are normal.   ____________________________________________   LABS (all labs ordered are listed, but only abnormal results are displayed)  Labs Reviewed  COMPREHENSIVE METABOLIC PANEL - Abnormal; Notable for the following:       Result Value   Chloride 112 (*)  CO2 21 (*)    Glucose, Bld 123 (*)    Creatinine, Ser 1.25 (*)    Calcium 8.5 (*)    ALT 11 (*)    GFR calc non Af Amer 50 (*)    GFR calc Af Amer 58 (*)    All other components within normal limits  URINALYSIS, COMPLETE (UACMP) WITH MICROSCOPIC - Abnormal; Notable for the following:    Color, Urine YELLOW (*)    APPearance CLEAR (*)    Hgb urine dipstick LARGE (*)    Ketones, ur 5 (*)    Squamous Epithelial / LPF 6-30 (*)    All  other components within normal limits  BILIRUBIN, DIRECT - Abnormal; Notable for the following:    Bilirubin, Direct <0.1 (*)    All other components within normal limits  CBC  TROPONIN I  LIPASE, BLOOD   ____________________________________________  EKG  ED ECG REPORT I, Loney Hering, the attending physician, personally viewed and interpreted this ECG.   Date: 02/27/2016  EKG Time: 413  Rate: 62  Rhythm: normal sinus rhythm  Axis: normal  Intervals:none  ST&T Change: none  ____________________________________________  RADIOLOGY  CXR [place] Abd RUQ ____________________________________________   PROCEDURES  Procedure(s) performed: None  Procedures  Critical Care performed: No  ____________________________________________   INITIAL IMPRESSION / ASSESSMENT AND PLAN / ED COURSE  Pertinent labs & imaging results that were available during my care of the patient were reviewed by me and considered in my medical decision making (see chart for details).  This is a 46 year old female who comes into the hospital today with some right-sided chest pain. When I examined her she seemed to have some right upper quadrant abdominal pain. I check some blood work but I decided to send the patient for an ultrasound of her gallbladder. The patient's ultrasound did results showing that she had some cholelithiasis with some stones and sludge in the gallbladder neck. She also had a positive Murphy sign. I did give the patient dose of morphine when she initially arrived and she reports it decreased her pain to a 6. I will give the patient a second dose of morphine and I will admit her to the surgical service. I discussed the patient's case with Dr. Dahlia Byes who will admit the patient. Patient did receive a dose of ceftriaxone.  Clinical Course as of Feb 27 812  Wed Feb 27, 2016  0700 No active cardiopulmonary disease. DG Chest 2 View [AW]  0700 Cholelithiasis with stones in the  gallbladder neck and sludge in the gallbladder. Positive Murphy's sign. Changes are consistent with acute cholecystitis in the appropriate clinical setting.   US Abdomen Limited RUQ [AW]    Clinical Course User Index [AW] Loney Hering, MD     ____________________________________________   FINAL CLINICAL IMPRESSION(S) / ED DIAGNOSES  Final diagnoses:  Abdominal pain  Cholecystitis      NEW MEDICATIONS STARTED DURING THIS VISIT:  New Prescriptions   No medications on file     Note:  This document was prepared using Dragon voice recognition software and may include unintentional dictation errors.    Loney Hering, MD 02/27/16 [phone]

## 2016-02-27 NOTE — Anesthesia Procedure Notes (Signed)
Procedure Name: Intubation Date/Time: 02/27/2016 2:50 PM Performed by: Allean Found Pre-anesthesia Checklist: Patient identified, Emergency Drugs available, Suction available, Patient being monitored and Timeout performed Patient Re-evaluated:Patient Re-evaluated prior to inductionOxygen Delivery Method: Circle system utilized Preoxygenation: Pre-oxygenation with 100% oxygen Intubation Type: IV induction Ventilation: Mask ventilation without difficulty Laryngoscope Size: Mac and 3 Grade View: Grade I Tube type: Oral Tube size: 7.0 mm Number of attempts: 1 Airway Equipment and Method: Stylet Placement Confirmation: ETT inserted through vocal cords under direct vision,  positive ETCO2 and breath sounds checked- equal and bilateral

## 2016-02-28 ENCOUNTER — Encounter: Payer: Self-pay | Admitting: Surgery

## 2016-02-28 MED ORDER — OXYCODONE-ACETAMINOPHEN 7.5-325 MG PO TABS: 1.0000 | ORAL_TABLET | ORAL | 0 refills | Status: DC | PRN

## 2016-02-28 NOTE — Progress Notes (Signed)
Discharge instructions reviewed with the patient.  IV removed.  Pt being sent out via wheelchair to waiting car

## 2016-02-28 NOTE — Plan of Care (Signed)
Problem: Physical Regulation: Goal: Postoperative complications will be avoided or minimized Outcome: Progressing VSS; I/O WNLs; minimal prn meds given.

## 2016-02-28 NOTE — Discharge Summary (Signed)
  Patient ID: Tracey Watkins MRN: [id number] DOB/AGE: 48/48/1972 48 y.o.  Admit date: 02/27/2016 Discharge date: 02/28/2016   Discharge Diagnoses:  Active Problems:   Cholecystitis, acute   Procedures: lap Chole  Hospital Course: 46 yo female admitted w classic signs and symptoms of cholecystitis. She was taking to the OR for an uneventful laparoscopic cholecystectomy and did very well post operatively. At the time of DC she was tolerating PO, her VSS and she was afebrile. Her PE at DC showed a female in NAD, awake and alert. Abd: soft, incisions c/d/i, no peritonitis or infection. Ext: no edema and well perfused. Condition at the time of DC is stable   Disposition: Final discharge disposition not confirmed  Discharge Instructions    Call MD for:  difficulty breathing, headache or visual disturbances    Complete by:  As directed    Call MD for:  extreme fatigue    Complete by:  As directed    Call MD for:  hives    Complete by:  As directed    Call MD for:  persistant dizziness or light-headedness    Complete by:  As directed    Call MD for:  persistant nausea and vomiting    Complete by:  As directed    Call MD for:  redness, tenderness, or signs of infection (pain, swelling, redness, odor or green/yellow discharge around incision site)    Complete by:  As directed    Call MD for:  severe uncontrolled pain    Complete by:  As directed    Call MD for:  temperature >100.4    Complete by:  As directed    Diet - low sodium heart healthy    Complete by:  As directed    Discharge instructions    Complete by:  As directed    Shower starting tomorrow   Increase activity slowly    Complete by:  As directed    Lifting restrictions    Complete by:  As directed    20 lbs x 6 wks   No wound care    Complete by:  As directed      Allergies as of 02/28/2016   No Known Allergies     Medication List    TAKE these medications   norethindrone 5 MG tablet Commonly known as:   AYGESTIN Take 5 mg by mouth daily.   oxyCODONE-acetaminophen 7.5-325 MG tablet Commonly known as:  PERCOCET Take 1 tablet by mouth every 4 (four) hours as needed for moderate pain (may take 2 tabs q 4 prn if pain is severe).   ranitidine 150 MG tablet Commonly known as:  ZANTAC Take 150 mg by mouth 2 (two) times daily.      Follow-up Information    Jules Husbands, MD. Go on 03/06/2016.   Specialty:  General Surgery Why:  Thusday at 8:45am for hospital follow-up Contact information: [place] [address] [phone]            Caroleen Hamman, MD FACS

## 2016-02-29 ENCOUNTER — Ambulatory Visit (INDEPENDENT_AMBULATORY_CARE_PROVIDER_SITE_OTHER): Payer: BLUE CROSS/BLUE SHIELD | Admitting: Surgery

## 2016-02-29 ENCOUNTER — Encounter: Payer: Self-pay | Admitting: Surgery

## 2016-02-29 VITALS — BP 118/72 | HR 102 | Temp 98.3°F | Ht 67.0 in | Wt 196.4 lb

## 2016-02-29 DIAGNOSIS — Z9049 Acquired absence of other specified parts of digestive tract: Secondary | ICD-10-CM

## 2016-02-29 DIAGNOSIS — T7840XA Allergy, unspecified, initial encounter: Secondary | ICD-10-CM

## 2016-02-29 LAB — SURGICAL PATHOLOGY

## 2016-02-29 NOTE — Patient Instructions (Signed)
Drug Allergy A drug allergy is when the body's disease-fighting system (immune system) reacts badly to a medicine. Drug allergies range from mild to severe, and they can be life-threatening in some cases. Some allergic reactions occur one week or more after you are exposed to a medicine (delayed reaction). A sudden (acute), severe allergic reaction that affects multiple areas of the body is called an anaphylactic reaction (anaphylaxis). Anaphylaxis can be life-threatening. All allergic reactions to a medicine require medical evaluation, even if an allergic reaction appears to be mild (minor). What are the causes? Drug allergies happen when the immune system wrongly identifies a medicine as being harmful. When this happens, the body releases proteins (antibodies) and other compounds, such as histamine, into the bloodstream. This causes swelling in certain tissues and loss of blood pressure to important areas, such as the heart and lungs. Almost any medicine can cause an allergic reaction. Medicines that commonly cause allergic reactions (are common allergens) include:  Penicillin.  Sulfa medicines (sulfonamides).  Medicines that numb certain areas of the body (local anesthetics).  X-ray dyes that contain iodine. What are the signs or symptoms? Mild Allergic Reaction  Nasal congestion.  Tingling in the mouth.  An itchy, red rash. Severe Allergic Reaction  Swelling of the eyes, lips, face, or tongue.  Swelling of the back of the mouth and the throat.  Wheezing.  A hoarse voice.  Itchy, red, swollen areas of skin (hives).  Dizziness or light-headedness.  Fainting.  Anxiety or confusion.  Abdominal pain.  Difficulty breathing, speaking, or swallowing.  Chest tightness.  Fast or irregular heartbeats (palpitations).  Vomiting.  Diarrhea. How is this diagnosed? This condition is diagnosed from a physical exam and your history of recent exposure to one or more medicines.  You may be referred for follow-up testing by a health care provider who specializes in allergies. This testing can confirm the diagnosis of a drug allergy and determine which medicines you are allergic to. Testing may include:  Skin tests. These may involve:  Injecting a small amount of the possible allergen between layers of your skin (intradermal injection).  Applying patches to your skin.  Blood tests.  Drug challenge. For this test, a health care provider gives you a small amount of a medicine in gradual doses while watching for an allergic reaction. If you are unsure of what caused your allergic reaction, your health care provider may ask you for:  Information about all medicines that you take on a regular basis, including:  The name of each medicine.  How much (dosage) and how many times you take each medicine per day.  The form of each medicine, such as pill, liquid, or injection.  The date and time of your reaction. How is this treated? There is no cure for allergies. However, an allergic reaction can be treated with:  Medicines that help:  To reduce pain and swelling (NSAIDs).  To relieve itching and hives (antihistamines).  To reduce swelling (corticosteroids).  Respiratory inhalers. These are inhaled medicines that help to widen (dilate) the airways in your lungs.  Injections of medicine that helps to relax the muscles in your airways and tighten your blood vessels (epinephrine). Severe allergic reactions, such as anaphylaxis, require immediate treatment in a hospital. If you have an anaphylactic reaction, you may need to be hospitalized for observation. You may be prescribed rescue medicines, such as epinephrine. Epinephrine comes in many forms, including what is commonly called an auto-injector "pen" (pre-filled automatic epinephrine injection device). Follow  these instructions at home: If You Have a Severe Allergy  Always keep an auto-injector pen or your  anaphylaxis kit near you. These can be lifesaving if you have a severe reaction. Use your auto-injector pen or anaphylaxis kit as told by your health care provider.  Make sure that you, the members of your household, and your employer know:  How to use an anaphylaxis kit.  How to use an auto-injector pen to give you an epinephrine injection.  Replace your epinephrine immediately after you use your auto-injector pen, in case you have another reaction.  Wear a medical alert bracelet or necklace that states your drug allergy, if told by your health care provider. General instructions  Avoidmedicines that you are allergic to.  Take over-the-counter and prescription medicines only as told by your health care provider.  If you were given medicines to treat your reaction, do not drive until your health care provider approves.  If you have hives or a rash:  Use an over-the-counter antihistamineas told by your health care provider.  Apply cold, wet cloths (cold compresses) to your skin or take baths or showers in cool water. Avoid hot water.  If you had tests done, it is your responsibility to get your test results. Ask your health care provider when your results will be ready.  Tell any health care providers who care for you that you have a drug allergy.  Keep all follow-up visits as told by your health care provider. This is important. Contact a health care provider if:  You think that you are having an allergic reaction. Symptoms of an allergic reaction usually start within 30 minutes after you are exposed to a medicine.  You have symptoms that last more than 2 days after your reaction.  Your symptoms get worse.  You develop new symptoms. Get help right away if:  You needed to use epinephrine.  An epinephrine injection helps to manage life-threatening allergic reactions, but you still need to go to the emergency room even if epinephrine seems to work. This is important because  anaphylaxis may happen again within 72 hours (rebound anaphylaxis).  If you used epinephrine to treat anaphylaxis outside of the hospital, you need additional medical care. This may include more doses of epinephrine.  You develop symptoms of a severe allergic reaction. These symptoms may represent a serious problem that is an emergency. Do not wait to see if the symptoms will go away. Use your auto-injector pen or anaphylaxis kit as you have been instructed, and get medical help right away. Call your local emergency services (911 in the [place] Do not drive yourself to the hospital.  This information is not intended to replace advice given to you by your health care provider. Make sure you discuss any questions you have with your health care provider. Document Released: 12/30/2004 Document Revised: 05/11/2015 Document Reviewed: 08/01/2014 Elsevier Interactive Patient Education  2017 [place].   The dosing for Benadryl is 25-52m every 4-8 hours as needed for itching and reaction. Maximum dosage is 3024mdaily.  Please take your Zantac twice daily for the next 5 days as well to aide in resolving this reaction.  You may remove the dermabond on Monday, please use Hydrogen Peroxide to dissolve this.

## 2016-02-29 NOTE — Progress Notes (Signed)
02/29/2016  HPI: Patient is s/p laparoscopic cholecystectomy with Dr. Dahlia Byes on 2/14.  She was discharged to home on 2/15 and called this morning reporting redness around her incisions, particularly the periumbilical incision.  Denies any worsening pain, but describes discomfort and itchiness over the wounds.  Denies any drainage, fevers, or chills.  Vital signs: BP 118/72   Pulse (!) 102   Temp 98.3 F (36.8 C) (Oral)   Ht 5\' 7"  (1.702 m)   Wt 89.1 kg (196 lb 6.4 oz)   BMI 30.76 kg/m    Physical Exam: Constitutional: No acute distress Abdomen:  Soft, non-distended, appropriately tender to palpation.  Incisions and clean and dry, with surrounding erythema.  The periumbilical incision has erythema extending approximately 2 cm on either side of the incision.  The subxyphoid incision was erythema extending approximately 1 cm.  There is no induration or evidence of infection.  Assessment/Plan: 46 yo female s/p laparoscopic cholecystectomy, now with likely an allergic reaction to the DermaBond used over the incisions.  --Reassured patient that this is unlikely to be an infection as there is no induration or drainage or hot to touch.  Likely to be an allergic reaction. --Patient may take Benadryl for the allergy and itchiness. --Instructed the patient to keep the DermaBond on for now and on 2/19 to gently use Hydrogen Peroxide over the incisions to dissolve it. --Patient understands that if the redness is worsening or spreading, to call or come to emergency room for further evaluation.   Melvyn Neth, [place]

## 2016-03-06 ENCOUNTER — Ambulatory Visit (INDEPENDENT_AMBULATORY_CARE_PROVIDER_SITE_OTHER): Payer: BLUE CROSS/BLUE SHIELD | Admitting: Surgery

## 2016-03-06 ENCOUNTER — Encounter: Payer: Self-pay | Admitting: Surgery

## 2016-03-06 VITALS — BP 117/74 | HR 94 | Temp 98.0°F | Ht 67.0 in | Wt 195.0 lb

## 2016-03-06 DIAGNOSIS — Z09 Encounter for follow-up examination after completed treatment for conditions other than malignant neoplasm: Secondary | ICD-10-CM

## 2016-03-06 NOTE — Progress Notes (Signed)
S/p lap chole  Developed dermatitis to dermabond Doing very well No pain, taking PO Path d/w pt  PE NAD Abd: incisions c/d/i, no infection or induration, no peritonitis  A/p Doing well No complications F/u prn Lifting restrictions x 6 wks

## 2016-03-06 NOTE — Patient Instructions (Addendum)
Please call our office with any questions or concerns.  Please do not submerge in a tub, hot tub, or pool until incisions are completely sealed.  Use sun block to incision area over the next year if this area will be exposed to sun. This helps decrease scarring.  You may resume your normal activities on 04/09/16. At that time- Listen to your body when lifting, if you have pain when lifting, stop and then try again in a few days. Pain after doing exercises or activities of daily living is normal as you get back in to your normal routine.  If you develop redness, drainage, or pain at incision sites- call our office immediately and speak with a nurse.  Please see your return to work note provided.

## 2016-05-12 DIAGNOSIS — J029 Acute pharyngitis, unspecified: Secondary | ICD-10-CM | POA: Diagnosis not present

## 2016-05-18 DIAGNOSIS — J069 Acute upper respiratory infection, unspecified: Secondary | ICD-10-CM | POA: Diagnosis not present

## 2016-05-18 DIAGNOSIS — J3089 Other allergic rhinitis: Secondary | ICD-10-CM | POA: Diagnosis not present

## 2016-07-09 ENCOUNTER — Ambulatory Visit (INDEPENDENT_AMBULATORY_CARE_PROVIDER_SITE_OTHER): Payer: BLUE CROSS/BLUE SHIELD | Admitting: Obstetrics and Gynecology

## 2016-07-09 ENCOUNTER — Encounter: Payer: Self-pay | Admitting: Obstetrics and Gynecology

## 2016-07-09 DIAGNOSIS — Z1239 Encounter for other screening for malignant neoplasm of breast: Secondary | ICD-10-CM

## 2016-07-09 DIAGNOSIS — Z01419 Encounter for gynecological examination (general) (routine) without abnormal findings: Secondary | ICD-10-CM | POA: Diagnosis not present

## 2016-07-09 DIAGNOSIS — Z1231 Encounter for screening mammogram for malignant neoplasm of breast: Secondary | ICD-10-CM | POA: Diagnosis not present

## 2016-07-09 DIAGNOSIS — Z1211 Encounter for screening for malignant neoplasm of colon: Secondary | ICD-10-CM

## 2016-07-09 MED ORDER — NORETHINDRONE ACETATE 5 MG PO TABS: 5.0000 mg | ORAL_TABLET | Freq: Every day | ORAL | 11 refills | Status: DC

## 2016-07-09 NOTE — Patient Instructions (Signed)
Preventive Care 40-64 Years, Female Preventive care refers to lifestyle choices and visits with your health care provider that can promote health and wellness. What does preventive care include?  A yearly physical exam. This is also called an annual well check.  Dental exams once or twice a year.  Routine eye exams. Ask your health care provider how often you should have your eyes checked.  Personal lifestyle choices, including: ? Daily care of your teeth and gums. ? Regular physical activity. ? Eating a healthy diet. ? Avoiding tobacco and drug use. ? Limiting alcohol use. ? Practicing safe sex. ? Taking low-dose aspirin daily starting at age 10. ? Taking vitamin and mineral supplements as recommended by your health care provider. What happens during an annual well check? The services and screenings done by your health care provider during your annual well check will depend on your age, overall health, lifestyle risk factors, and family history of disease. Counseling Your health care provider may ask you questions about your:  Alcohol use.  Tobacco use.  Drug use.  Emotional well-being.  Home and relationship well-being.  Sexual activity.  Eating habits.  Work and work Statistician.  Method of birth control.  Menstrual cycle.  Pregnancy history.  Screening You may have the following tests or measurements:  Height, weight, and BMI.  Blood pressure.  Lipid and cholesterol levels. These may be checked every 5 years, or more frequently if you are over 74 years old.  Skin check.  Lung cancer screening. You may have this screening every year starting at age 40 if you have a 30-pack-year history of smoking and currently smoke or have quit within the past 15 years.  Fecal occult blood test (FOBT) of the stool. You may have this test every year starting at age 44.  Flexible sigmoidoscopy or colonoscopy. You may have a sigmoidoscopy every 5 years or a colonoscopy  every 10 years starting at age 63.  Hepatitis C blood test.  Hepatitis B blood test.  Sexually transmitted disease (STD) testing.  Diabetes screening. This is done by checking your blood sugar (glucose) after you have not eaten for a while (fasting). You may have this done every 1-3 years.  Mammogram. This may be done every 1-2 years. Talk to your health care provider about when you should start having regular mammograms. This may depend on whether you have a family history of breast cancer.  BRCA-related cancer screening. This may be done if you have a family history of breast, ovarian, tubal, or peritoneal cancers.  Pelvic exam and Pap test. This may be done every 3 years starting at age 64. Starting at age 44, this may be done every 5 years if you have a Pap test in combination with an HPV test.  Bone density scan. This is done to screen for osteoporosis. You may have this scan if you are at high risk for osteoporosis.  Discuss your test results, treatment options, and if necessary, the need for more tests with your health care provider. Vaccines Your health care provider may recommend certain vaccines, such as:  Influenza vaccine. This is recommended every year.  Tetanus, diphtheria, and acellular pertussis (Tdap, Td) vaccine. You may need a Td booster every 10 years.  Varicella vaccine. You may need this if you have not been vaccinated.  Zoster vaccine. You may need this after age 51.  Measles, mumps, and rubella (MMR) vaccine. You may need at least one dose of MMR if you were born in  1957 or later. You may also need a second dose.  Pneumococcal 13-valent conjugate (PCV13) vaccine. You may need this if you have certain conditions and were not previously vaccinated.  Pneumococcal polysaccharide (PPSV23) vaccine. You may need one or two doses if you smoke cigarettes or if you have certain conditions.  Meningococcal vaccine. You may need this if you have certain  conditions.  Hepatitis A vaccine. You may need this if you have certain conditions or if you travel or work in places where you may be exposed to hepatitis A.  Hepatitis B vaccine. You may need this if you have certain conditions or if you travel or work in places where you may be exposed to hepatitis B.  Haemophilus influenzae type b (Hib) vaccine. You may need this if you have certain conditions.  Talk to your health care provider about which screenings and vaccines you need and how often you need them. This information is not intended to replace advice given to you by your health care provider. Make sure you discuss any questions you have with your health care provider. Document Released: 01/26/2015 Document Revised: 09/19/2015 Document Reviewed: 10/31/2014 Elsevier Interactive Patient Education  2017 [employer].

## 2016-07-09 NOTE — Progress Notes (Signed)
Patient ID: Tracey Watkins, female   DOB: 25-Jul-1970, 46 y.o.   MRN: [id number]     Gynecology Annual Exam  PCP: Patient, No Pcp Per  Chief Complaint:  Chief Complaint  Patient presents with  . Gynecologic Exam    History of Present Illness: Patient is a 46 y.o. G3P3 presents for annual exam. The patient has no complaints today. Cholecystectomy in February.  LMP: No LMP recorded. Patient is not currently having periods (Reason: Oral contraceptives). Absent on continuous norethindrone for endometriosis.  The patient is sexually active. She currently uses none for contraception. She denies dyspareunia.  The patient does perform self breast exams.  There is notable family history of breast or ovarian cancer in her family (paternal grandmother age 39).  The patient wears seatbelts: yes.   The patient has regular exercise: not asked.    The patient denies current symptoms of depression.    Review of Systems: Review of Systems  Constitutional: Negative for chills and fever.  HENT: Negative for congestion.   Respiratory: Negative for cough and shortness of breath.   Cardiovascular: Negative for chest pain and palpitations.  Gastrointestinal: Negative for abdominal pain, constipation, diarrhea, heartburn, nausea and vomiting.  Genitourinary: Negative for dysuria, frequency and urgency.  Skin: Negative for itching and rash.  Neurological: Negative for dizziness and headaches.  Endo/Heme/Allergies: Negative for polydipsia.  Psychiatric/Behavioral: Negative for depression.    Past Medical History:  Past Medical History:  Diagnosis Date  . Carpal tunnel syndrome    Left Wrist  . Cholecystitis    Requiring Lap Chole (02/28/16)    Past Surgical History:  Past Surgical History:  Procedure Laterality Date  . CHOLECYSTECTOMY N/A 02/27/2016   Procedure: LAPAROSCOPIC CHOLECYSTECTOMY;  Surgeon: Jules Husbands, MD;  Location: [facility] ORS;  Service: General;  Laterality: N/A;  . DIAGNOSTIC  LAPAROSCOPY    . TUBAL LIGATION      Gynecologic History:  No LMP recorded. Patient is not currently having periods (Reason: Oral contraceptives). Contraception: tubal ligation Last Pap: Results were: 07/04/14 NIL and HR HPV negative  Last mammogram: 07/05/15 Results were: BI-RAD I Obstetric History: G3P3  Family History:  Family History  Problem Relation Age of Onset  . Breast cancer Paternal Grandmother 59  . Healthy Mother   . Healthy Father     Social History:  Social History   Social History  . Marital status: Married    Spouse name: N/A  . Number of children: N/A  . Years of education: N/A   Occupational History  . Not on file.   Social History Main Topics  . Smoking status: Never Smoker  . Smokeless tobacco: Never Used  . Alcohol use No  . Drug use: No  . Sexual activity: Yes    Birth control/ protection: Pill   Other Topics Concern  . Not on file   Social History Narrative  . No narrative on file    Allergies:  Allergies  Allergen Reactions  . Skin Adhesives [Cyanoacrylate] Rash    "Dermabond"    Medications: Prior to Admission medications   Medication Sig Start Date End Date Taking? Authorizing Provider  norethindrone (AYGESTIN) 5 MG tablet Take 5 mg by mouth daily. 09/27/13   [provider]  ranitidine (ZANTAC) 150 MG tablet Take 150 mg by mouth 2 (two) times daily.    [provider]    Physical Exam Vitals: Blood pressure 102/68, pulse 96, height 5\' 7"  (1.702 m), weight 204 lb (92.5 kg).  General: NAD HEENT: normocephalic, anicteric Thyroid: no enlargement, no palpable nodules Pulmonary: No increased work of breathing, CTAB Cardiovascular: RRR, distal pulses 2+ Breast: Breast symmetrical, no tenderness, no palpable nodules or masses, no skin or nipple retraction present, no nipple discharge.  No axillary or supraclavicular lymphadenopathy. Abdomen: NABS, soft, non-tender, non-distended.  Umbilicus without lesions.  No  hepatomegaly, splenomegaly or masses palpable. No evidence of hernia  Genitourinary:  External: Normal external female genitalia.  Normal urethral meatus, normal  Bartholin's and Skene's glands.    Vagina: Normal vaginal mucosa, no evidence of prolapse.    Cervix: Grossly normal in appearance, no bleeding  Uterus: Non-enlarged, mobile, normal contour.  No CMT  Adnexa: ovaries non-enlarged, no adnexal masses  Rectal: deferred  Lymphatic: no evidence of inguinal lymphadenopathy Extremities: no edema, erythema, or tenderness Neurologic: Grossly intact Psychiatric: mood appropriate, affect full  Female chaperone present for pelvic and breast  portions of the physical exam    Assessment: 46 y.o. G3P3 No problem-specific Assessment & Plan notes found for this encounter.   Plan: Problem List Items Addressed This Visit    None    Visit Diagnoses    Breast screening       Relevant Orders   MM DIGITAL SCREENING BILATERAL   Special screening for malignant neoplasms, colon       Relevant Orders   Ambulatory referral to Gastroenterology   Encounter for gynecological examination without abnormal finding          1) Mammogram - recommend yearly screening mammogram.  Mammogram Was ordered today   2) STI screening was not offered low risk  3) ASCCP guidelines and rational discussed.  Patient opts for every 3 years screening interval  4) Contraception - s/p BTL, continue norethindrone for endometriosis  5) Colonoscopy -- Screening recommended starting at age 22 for average risk individuals, age 23 for individuals deemed at increased risk (including African Americans) and recommended to continue until age 65.  For patient age 53-85 individualized approach is recommended.  Gold standard screening is via colonoscopy, Cologuard screening is an acceptable alternative for patient unwilling or unable to undergo colonoscopy.  "Colorectal cancer screening for average?risk adults: 2018 guideline  update from the American Cancer Society"CA: A Cancer Journal for Clinicians: Jun 11, 2016   6) Routine healthcare maintenance including cholesterol, diabetes screening discussed managed by PCP   7) RTC 1 year annual and pap

## 2016-07-24 ENCOUNTER — Ambulatory Visit
Admission: RE | Admit: 2016-07-24 | Discharge: 2016-07-24 | Disposition: A | Discharge status: Home / Self Care | Payer: BLUE CROSS/BLUE SHIELD | Source: Ambulatory Visit | Attending: Obstetrics and Gynecology | Admitting: Obstetrics and Gynecology

## 2016-07-24 DIAGNOSIS — Z1231 Encounter for screening mammogram for malignant neoplasm of breast: Secondary | ICD-10-CM | POA: Insufficient documentation

## 2016-07-24 DIAGNOSIS — Z1239 Encounter for other screening for malignant neoplasm of breast: Secondary | ICD-10-CM

## 2016-08-05 ENCOUNTER — Other Ambulatory Visit: Payer: Self-pay

## 2016-08-05 ENCOUNTER — Telehealth: Payer: Self-pay

## 2016-08-05 DIAGNOSIS — Z1211 Encounter for screening for malignant neoplasm of colon: Secondary | ICD-10-CM

## 2016-08-05 DIAGNOSIS — Z83719 Family history of colon polyps, unspecified: Secondary | ICD-10-CM

## 2016-08-05 DIAGNOSIS — Z8371 Family history of colonic polyps: Secondary | ICD-10-CM

## 2016-08-05 NOTE — Telephone Encounter (Signed)
Gastroenterology Pre-Procedure Review  Request Date: 09/16/16 Requesting Physician: Dr. Allen Norris  PATIENT REVIEW QUESTIONS: The patient responded to the following health history questions as indicated:    1. Are you having any GI issues? no 2. Do you have a personal history of Polyps? no 3. Do you have a family history of Colon Cancer or Polyps? yes (Father had 11 removed last year) 4. Diabetes Mellitus? no 5. Joint replacements in the past 12 months?no 6. Major health problems in the past 3 months?no 7. Any artificial heart valves, MVP, or defibrillator?no    MEDICATIONS & ALLERGIES:    Patient reports the following regarding taking any anticoagulation/antiplatelet therapy:   Plavix, Coumadin, Eliquis, Xarelto, Lovenox, Pradaxa, Brilinta, or Effient? no Aspirin? no  Patient confirms/reports the following medications:  Current Outpatient Prescriptions  Medication Sig Dispense Refill  . norethindrone (AYGESTIN) 5 MG tablet Take 1 tablet (5 mg total) by mouth daily. 30 tablet 11  . ranitidine (ZANTAC) 150 MG tablet Take 150 mg by mouth 2 (two) times daily.     No current facility-administered medications for this visit.     Patient confirms/reports the following allergies:  Allergies  Allergen Reactions  . Skin Adhesives [Cyanoacrylate] Rash    "Dermabond"    No orders of the defined types were placed in this encounter.   AUTHORIZATION INFORMATION Primary Insurance: 1D#: Group #:  Secondary Insurance: 1D#: Group #:  SCHEDULE INFORMATION: Date:09/16/16  Time: Location:[facility]

## 2016-09-12 ENCOUNTER — Encounter: Payer: Self-pay | Admitting: *Deleted

## 2016-09-16 ENCOUNTER — Ambulatory Visit: Payer: BLUE CROSS/BLUE SHIELD | Admitting: Anesthesiology

## 2016-09-16 ENCOUNTER — Encounter
Admission: RE | Disposition: A | Discharge status: Home / Self Care | Payer: Self-pay | Source: Ambulatory Visit | Attending: Gastroenterology

## 2016-09-16 ENCOUNTER — Ambulatory Visit
Admission: RE | Admit: 2016-09-16 | Discharge: 2016-09-16 | Disposition: A | Discharge status: Home / Self Care | Payer: BLUE CROSS/BLUE SHIELD | Source: Ambulatory Visit | Attending: Gastroenterology | Admitting: Gastroenterology

## 2016-09-16 DIAGNOSIS — Z91048 Other nonmedicinal substance allergy status: Secondary | ICD-10-CM | POA: Diagnosis not present

## 2016-09-16 DIAGNOSIS — Z1211 Encounter for screening for malignant neoplasm of colon: Secondary | ICD-10-CM | POA: Insufficient documentation

## 2016-09-16 DIAGNOSIS — Z8371 Family history of colonic polyps: Secondary | ICD-10-CM

## 2016-09-16 DIAGNOSIS — Z79899 Other long term (current) drug therapy: Secondary | ICD-10-CM | POA: Insufficient documentation

## 2016-09-16 DIAGNOSIS — Z803 Family history of malignant neoplasm of breast: Secondary | ICD-10-CM | POA: Diagnosis not present

## 2016-09-16 DIAGNOSIS — G5600 Carpal tunnel syndrome, unspecified upper limb: Secondary | ICD-10-CM | POA: Insufficient documentation

## 2016-09-16 DIAGNOSIS — K219 Gastro-esophageal reflux disease without esophagitis: Secondary | ICD-10-CM | POA: Diagnosis not present

## 2016-09-16 DIAGNOSIS — Z9049 Acquired absence of other specified parts of digestive tract: Secondary | ICD-10-CM | POA: Diagnosis not present

## 2016-09-16 DIAGNOSIS — Z5309 Procedure and treatment not carried out because of other contraindication: Secondary | ICD-10-CM | POA: Diagnosis not present

## 2016-09-16 HISTORY — PX: COLONOSCOPY WITH PROPOFOL: SHX5780

## 2016-09-16 LAB — POCT PREGNANCY, URINE: Preg Test, Ur: NEGATIVE

## 2016-09-16 SURGERY — COLONOSCOPY WITH PROPOFOL
Anesthesia: General

## 2016-09-16 MED ORDER — LIDOCAINE HCL (CARDIAC) 10 MG/ML IV SOLN
INTRAVENOUS | Status: DC | PRN
  Administered 2016-09-16: 60 mg via INTRAVENOUS

## 2016-09-16 MED ORDER — PROPOFOL 10 MG/ML IV BOLUS
INTRAVENOUS | Status: DC | PRN
  Administered 2016-09-16: 30 mg via INTRAVENOUS

## 2016-09-16 MED ORDER — SODIUM CHLORIDE 0.9 % IV SOLN
INTRAVENOUS | Status: DC
  Administered 2016-09-16: 10:00:00 via INTRAVENOUS

## 2016-09-16 MED ORDER — LIDOCAINE HCL (PF) 2 % IJ SOLN
INTRAMUSCULAR | Status: AC
  Filled 2016-09-16: qty 2

## 2016-09-16 MED ORDER — PROPOFOL 500 MG/50ML IV EMUL
INTRAVENOUS | Status: DC | PRN
  Administered 2016-09-16: 150 ug/kg/min via INTRAVENOUS

## 2016-09-16 MED ORDER — PROPOFOL 500 MG/50ML IV EMUL
INTRAVENOUS | Status: AC
  Filled 2016-09-16: qty 50

## 2016-09-16 NOTE — Transfer of Care (Signed)
Immediate Anesthesia Transfer of Care Note  Patient: Tracey Watkins  Procedure(s) Performed: Procedure(s): COLONOSCOPY WITH PROPOFOL (N/A)  Patient Location: PACU  Anesthesia Type:General  Level of Consciousness: sedated  Airway & Oxygen Therapy: Patient Spontanous Breathing and Patient connected to nasal cannula oxygen  Post-op Assessment: Report given to RN and Post -op Vital signs reviewed and stable  Post vital signs: Reviewed and stable  Last Vitals:  Vitals:   09/16/16 0926 09/16/16 1043  BP: 114/68 (!) 93/57  Pulse: 72 63  Resp:  12  Temp: 37.1 C (!) 36.3 C  SpO2: 100% 100%    Last Pain:  Vitals:   09/16/16 1043  TempSrc: Tympanic         Complications: No apparent anesthesia complications

## 2016-09-16 NOTE — Anesthesia Procedure Notes (Signed)
Date/Time: 09/16/2016 10:28 AM Performed by: Johnna Acosta Pre-anesthesia Checklist: Patient identified, Emergency Drugs available, Suction available, Patient being monitored and Timeout performed Patient Re-evaluated:Patient Re-evaluated prior to induction Oxygen Delivery Method: Nasal cannula

## 2016-09-16 NOTE — Anesthesia Post-op Follow-up Note (Signed)
Anesthesia QCDR form completed.        

## 2016-09-16 NOTE — Anesthesia Preprocedure Evaluation (Signed)
Anesthesia Evaluation  Patient identified by MRN, date of birth, ID band Patient awake    Reviewed: Allergy & Precautions, NPO status , Patient's Chart, lab work & pertinent test results  History of Anesthesia Complications Negative for: history of anesthetic complications  Airway Mallampati: III  TM Distance: >3 FB     Dental  (+) Chipped   Pulmonary neg pulmonary ROS,           Cardiovascular negative cardio ROS       Neuro/Psych negative neurological ROS  negative psych ROS   GI/Hepatic Neg liver ROS, GERD  ,  Endo/Other  negative endocrine ROS  Renal/GU negative Renal ROS  negative genitourinary   Musculoskeletal negative musculoskeletal ROS (+)   Abdominal   Peds negative pediatric ROS (+)  Hematology negative hematology ROS (+)   Anesthesia Other Findings History reviewed. No pertinent past medical history.  Acute cholecystitis  Reproductive/Obstetrics negative OB ROS                             Anesthesia Physical  Anesthesia Plan  ASA: II  Anesthesia Plan: General   Post-op Pain Management:    Induction: Intravenous  PONV Risk Score and Plan: 3 and Propofol infusion  Airway Management Planned: Natural Airway and Nasal Cannula  Additional Equipment:   Intra-op Plan:   Post-operative Plan:   Informed Consent: I have reviewed the patients History and Physical, chart, labs and discussed the procedure including the risks, benefits and alternatives for the proposed anesthesia with the patient or authorized representative who has indicated his/her understanding and acceptance.   Dental advisory given  Plan Discussed with: CRNA and Surgeon  Anesthesia Plan Comments:         Anesthesia Quick Evaluation

## 2016-09-16 NOTE — Op Note (Addendum)
[facility] Patient Name: Tracey Watkins Procedure Date: 09/16/2016 10:23 AM MRN: [id number] Account #: [id number] Date of Birth: 1970-12-31 Admit Type: Outpatient Age: 46 Room: [facility] ENDO ROOM 4 Gender: Female Note Status: Finalized Procedure:            Colonoscopy Indications:          Screening for colorectal malignant neoplasm Providers:            Lucilla Lame MD, MD Referring MD:         Stoney Bang. Staebler (Referring MD) Medicines:            Propofol per Anesthesia Complications:        No immediate complications. Procedure:            Pre-Anesthesia Assessment:                       - Prior to the procedure, a History and Physical was                        performed, and patient medications and allergies were                        reviewed. The patient's tolerance of previous                        anesthesia was also reviewed. The risks and benefits of                        the procedure and the sedation options and risks were                        discussed with the patient. All questions were                        answered, and informed consent was obtained. Prior                        Anticoagulants: The patient has taken no previous                        anticoagulant or antiplatelet agents. ASA Grade                        Assessment: II - A patient with mild systemic disease.                        After reviewing the risks and benefits, the patient was                        deemed in satisfactory condition to undergo the                        procedure.                       After obtaining informed consent, the colonoscope was                        passed under direct vision. Throughout the procedure,  the patient's blood pressure, pulse, and oxygen                        saturations were monitored continuously. The                        Colonoscope was introduced through the anus with the                         intention of advancing to the splenic flexure. The                        scope was advanced to the sigmoid colon before the                        procedure was aborted. Medications were given. The                        colonoscopy was performed without difficulty. The                        patient tolerated the procedure well. The quality of                        the bowel preparation was not adequate to identify                        polyps 6 mm and larger in size. Findings:      The perianal and digital rectal examinations were normal.      A large amount of stool was found in the sigmoid colon, precluding       visualization. Impression:           - Preparation of the colon was inadequate.                       - Stool in the sigmoid colon.                       - No specimens collected. Recommendation:       - Discharge patient to home.                       - Resume previous diet.                       - Continue present medications.                       - Repeat colonoscopy because the bowel preparation was                        poor. Procedure Code(s):    --- Professional ---                       [phone], 53, Colonoscopy, flexible; diagnostic, including                        collection of specimen(s) by brushing or washing, when                        performed (  separate procedure) Diagnosis Code(s):    --- Professional ---                       Z12.11, Encounter for screening for malignant neoplasm                        of colon CPT copyright 2016 American Medical Association. All rights reserved. The codes documented in this report are preliminary and upon coder review may  be revised to meet current compliance requirements. Lucilla Lame MD, MD 09/16/2016 10:41:54 AM This report has been signed electronically. Number of Addenda: 0 Note Initiated On: 09/16/2016 10:23 AM Total Procedure Duration: 0 hours 1 minute 0 seconds       [facility]

## 2016-09-16 NOTE — Anesthesia Postprocedure Evaluation (Signed)
Anesthesia Post Note  Patient: Tracey Watkins  Procedure(s) Performed: Procedure(s) (LRB): COLONOSCOPY WITH PROPOFOL (N/A)  Patient location during evaluation: Endoscopy Anesthesia Type: General Level of consciousness: awake and alert Pain management: pain level controlled Vital Signs Assessment: post-procedure vital signs reviewed and stable Respiratory status: spontaneous breathing, nonlabored ventilation, respiratory function stable and patient connected to nasal cannula oxygen Cardiovascular status: blood pressure returned to baseline and stable Postop Assessment: no signs of nausea or vomiting Anesthetic complications: no     Last Vitals:  Vitals:   09/16/16 1103 09/16/16 1113  BP: (!) 99/59 96/62  Pulse: 64 62  Resp: 15 16  Temp:    SpO2: 98% 98%    Last Pain:  Vitals:   09/16/16 1043  TempSrc: Tympanic                 Martha Clan

## 2016-09-16 NOTE — H&P (Signed)
   Lucilla Lame, MD [address] [place],  [address] Phone: [phone] Fax : [phone]  Primary Care Physician:  Malachy Mood, MD Primary Gastroenterologist:  Dr. Allen Norris  Pre-Procedure History & Physical: HPI:  Tracey Watkins is a 46 y.o. female is here for a screening colonoscopy.   Past Medical History:  Diagnosis Date  . Carpal tunnel syndrome    Left Wrist  . Cholecystitis    Requiring Lap Chole (02/28/16)    Past Surgical History:  Procedure Laterality Date  . CHOLECYSTECTOMY N/A 02/27/2016   Procedure: LAPAROSCOPIC CHOLECYSTECTOMY;  Surgeon: Jules Husbands, MD;  Location: [facility] ORS;  Service: General;  Laterality: N/A;  . DIAGNOSTIC LAPAROSCOPY    . TUBAL LIGATION      Prior to Admission medications   Medication Sig Start Date End Date Taking? Authorizing Provider  norethindrone (AYGESTIN) 5 MG tablet Take 1 tablet (5 mg total) by mouth daily. 07/09/16  Yes Malachy Mood, MD  ranitidine (ZANTAC) 150 MG tablet Take 150 mg by mouth 2 (two) times daily.   Yes [provider]    Allergies as of 08/05/2016 - Review Complete 07/09/2016  Allergen Reaction Noted  . Skin adhesives [cyanoacrylate] Rash 02/29/2016    Family History  Problem Relation Age of Onset  . Breast cancer Paternal Grandmother 66  . Healthy Mother   . Healthy Father     Social History   Social History  . Marital status: Married    Spouse name: N/A  . Number of children: N/A  . Years of education: N/A   Occupational History  . Not on file.   Social History Main Topics  . Smoking status: Never Smoker  . Smokeless tobacco: Never Used  . Alcohol use No  . Drug use: No  . Sexual activity: Yes    Birth control/ protection: Pill   Other Topics Concern  . Not on file   Social History Narrative  . No narrative on file    Review of Systems: See HPI, otherwise negative ROS  Physical Exam: BP 114/68   Pulse 72   Temp 98.8 F (37.1 C) (Tympanic)    Ht 5\' 7"  (1.702 m)   Wt 200 lb (90.7 kg)   LMP 05/14/2011 (Approximate)   SpO2 100%   BMI 31.32 kg/m  General:   Alert,  pleasant and cooperative in NAD Head:  Normocephalic and atraumatic. Neck:  Supple; no masses or thyromegaly. Lungs:  Clear throughout to auscultation.    Heart:  Regular rate and rhythm. Abdomen:  Soft, nontender and nondistended. Normal bowel sounds, without guarding, and without rebound.   Neurologic:  Alert and  oriented x4;  grossly normal neurologically.  Impression/Plan: Tracey Watkins is now here to undergo a screening colonoscopy.  Risks, benefits, and alternatives regarding colonoscopy have been reviewed with the patient.  Questions have been answered.  All parties agreeable.

## 2016-09-17 ENCOUNTER — Encounter: Payer: Self-pay | Admitting: Gastroenterology

## 2016-09-23 ENCOUNTER — Other Ambulatory Visit: Payer: Self-pay

## 2016-09-23 DIAGNOSIS — Z1211 Encounter for screening for malignant neoplasm of colon: Secondary | ICD-10-CM

## 2016-09-24 ENCOUNTER — Telehealth: Payer: Self-pay | Admitting: Gastroenterology

## 2016-09-24 ENCOUNTER — Other Ambulatory Visit: Payer: Self-pay

## 2016-09-24 MED ORDER — NA SULFATE-K SULFATE-MG SULF 17.5-3.13-1.6 GM/177ML PO SOLN: 1.0000 | ORAL | 0 refills | Status: DC

## 2016-09-24 NOTE — Telephone Encounter (Signed)
Please call patient to let her know if her prep was sent to her or mailed. Please let her know today. She has already been to the drug store looking for it.

## 2016-09-25 NOTE — Telephone Encounter (Signed)
Bowel prep called into pt's pharmacy per her request.

## 2016-10-07 ENCOUNTER — Encounter
Admission: RE | Disposition: A | Discharge status: Home / Self Care | Payer: Self-pay | Source: Ambulatory Visit | Attending: Gastroenterology

## 2016-10-07 ENCOUNTER — Ambulatory Visit: Payer: BLUE CROSS/BLUE SHIELD | Admitting: Anesthesiology

## 2016-10-07 ENCOUNTER — Ambulatory Visit
Admission: RE | Admit: 2016-10-07 | Discharge: 2016-10-07 | Disposition: A | Discharge status: Home / Self Care | Payer: BLUE CROSS/BLUE SHIELD | Source: Ambulatory Visit | Attending: Gastroenterology | Admitting: Gastroenterology

## 2016-10-07 DIAGNOSIS — Z1211 Encounter for screening for malignant neoplasm of colon: Secondary | ICD-10-CM

## 2016-10-07 DIAGNOSIS — D120 Benign neoplasm of cecum: Secondary | ICD-10-CM | POA: Diagnosis not present

## 2016-10-07 DIAGNOSIS — K635 Polyp of colon: Secondary | ICD-10-CM | POA: Diagnosis not present

## 2016-10-07 DIAGNOSIS — Z803 Family history of malignant neoplasm of breast: Secondary | ICD-10-CM | POA: Insufficient documentation

## 2016-10-07 DIAGNOSIS — Z9049 Acquired absence of other specified parts of digestive tract: Secondary | ICD-10-CM | POA: Insufficient documentation

## 2016-10-07 DIAGNOSIS — Z91048 Other nonmedicinal substance allergy status: Secondary | ICD-10-CM | POA: Insufficient documentation

## 2016-10-07 HISTORY — PX: COLONOSCOPY WITH PROPOFOL: SHX5780

## 2016-10-07 SURGERY — COLONOSCOPY WITH PROPOFOL
Anesthesia: General

## 2016-10-07 MED ORDER — PROPOFOL 500 MG/50ML IV EMUL
INTRAVENOUS | Status: DC | PRN
  Administered 2016-10-07: 150 ug/kg/min via INTRAVENOUS

## 2016-10-07 MED ORDER — LIDOCAINE HCL (PF) 1 % IJ SOLN
INTRAMUSCULAR | Status: AC
  Administered 2016-10-07: 0.3 mL via INTRADERMAL
  Filled 2016-10-07: qty 2

## 2016-10-07 MED ORDER — LIDOCAINE HCL (PF) 2 % IJ SOLN
INTRAMUSCULAR | Status: AC
  Filled 2016-10-07: qty 2

## 2016-10-07 MED ORDER — LIDOCAINE HCL (CARDIAC) 20 MG/ML IV SOLN
INTRAVENOUS | Status: DC | PRN
  Administered 2016-10-07: 40 mg via INTRAVENOUS

## 2016-10-07 MED ORDER — SODIUM CHLORIDE 0.9 % IV SOLN
INTRAVENOUS | Status: DC
  Administered 2016-10-07: 10:00:00 via INTRAVENOUS
  Administered 2016-10-07: 1000 mL via INTRAVENOUS

## 2016-10-07 MED ORDER — PROPOFOL 500 MG/50ML IV EMUL
INTRAVENOUS | Status: AC
  Filled 2016-10-07: qty 50

## 2016-10-07 MED ORDER — PROPOFOL 10 MG/ML IV BOLUS
INTRAVENOUS | Status: DC | PRN
  Administered 2016-10-07: 60 mg via INTRAVENOUS

## 2016-10-07 MED ORDER — LIDOCAINE HCL (PF) 1 % IJ SOLN
2.0000 mL | Freq: Once | INTRAMUSCULAR | Status: AC
  Administered 2016-10-07: 0.3 mL via INTRADERMAL

## 2016-10-07 NOTE — H&P (Signed)
   Tracey Friedt, MD FACG [address] [place], [place] [address] Phone: [phone] Fax : [phone]  Primary Care Physician:  Staebler, Andreas, MD Primary Gastroenterologist:  Dr. Gresia Isidoro  Pre-Procedure History & Physical: HPI:  Tracey Watkins is a 46 y.o. female is here for a screening colonoscopy.   Past Medical History:  Diagnosis Date  . Carpal tunnel syndrome    Left Wrist  . Cholecystitis    Requiring Lap Chole (02/28/16)    Past Surgical History:  Procedure Laterality Date  . CHOLECYSTECTOMY N/A 02/27/2016   Procedure: LAPAROSCOPIC CHOLECYSTECTOMY;  Surgeon: Diego F Pabon, MD;  Location: [facility] ORS;  Service: General;  Laterality: N/A;  . COLONOSCOPY WITH PROPOFOL N/A 09/16/2016   Procedure: COLONOSCOPY WITH PROPOFOL;  Surgeon: Elliannah Wayment, MD;  Location: [facility] ENDOSCOPY;  Service: Endoscopy;  Laterality: N/A;  . DIAGNOSTIC LAPAROSCOPY    . TUBAL LIGATION      Prior to Admission medications   Medication Sig Start Date End Date Taking? Authorizing Provider  Na Sulfate-K Sulfate-Mg Sulf (SUPREP BOWEL PREP KIT) 17.5-3.13-1.6 GM/180ML SOLN Take 1 kit by mouth as directed. 09/24/16   Ratasha Fabre, MD  norethindrone (AYGESTIN) 5 MG tablet Take 1 tablet (5 mg total) by mouth daily. 07/09/16   Staebler, Andreas, MD  ranitidine (ZANTAC) 150 MG tablet Take 150 mg by mouth 2 (two) times daily.    [provider]    Allergies as of 09/23/2016 - Review Complete 09/16/2016  Allergen Reaction Noted  . Skin adhesives [cyanoacrylate] Rash 02/29/2016    Family History  Problem Relation Age of Onset  . Breast cancer Paternal Grandmother 80  . Healthy Mother   . Healthy Father     Social History   Social History  . Marital status: Married    Spouse name: N/A  . Number of children: N/A  . Years of education: N/A   Occupational History  . Not on file.   Social History Main Topics  . Smoking status: Never Smoker  . Smokeless tobacco: Never Used  . Alcohol use  No  . Drug use: No  . Sexual activity: Yes    Birth control/ protection: Pill   Other Topics Concern  . Not on file   Social History Narrative  . No narrative on file    Review of Systems: See HPI, otherwise negative ROS  Physical Exam: BP 107/73   Pulse 61   Temp 98 F (36.7 C) (Tympanic)   Resp 16   Ht 5' 6" (1.676 m)   Wt 200 lb (90.7 kg)   SpO2 100%   BMI 32.28 kg/m  General:   Alert,  pleasant and cooperative in NAD Head:  Normocephalic and atraumatic. Neck:  Supple; no masses or thyromegaly. Lungs:  Clear throughout to auscultation.    Heart:  Regular rate and rhythm. Abdomen:  Soft, nontender and nondistended. Normal bowel sounds, without guarding, and without rebound.   Neurologic:  Alert and  oriented x4;  grossly normal neurologically.  Impression/Plan: Tracey Watkins is now here to undergo a screening colonoscopy.  Risks, benefits, and alternatives regarding colonoscopy have been reviewed with the patient.  Questions have been answered.  All parties agreeable. 

## 2016-10-07 NOTE — Anesthesia Preprocedure Evaluation (Signed)
Anesthesia Evaluation  Patient identified by MRN, date of birth, ID band Patient awake    Reviewed: Allergy & Precautions, NPO status , Patient's Chart, lab work & pertinent test results  History of Anesthesia Complications Negative for: history of anesthetic complications  Airway Mallampati: II  TM Distance: >3 FB Neck ROM: Full    Dental no notable dental hx.    Pulmonary neg pulmonary ROS, neg sleep apnea, neg COPD,    breath sounds clear to auscultation- rhonchi (-) wheezing      Cardiovascular Exercise Tolerance: Good (-) hypertension(-) CAD, (-) Past MI and (-) Cardiac Stents  Rhythm:Regular Rate:Normal - Systolic murmurs and - Diastolic murmurs    Neuro/Psych negative neurological ROS  negative psych ROS   GI/Hepatic negative GI ROS, Neg liver ROS,   Endo/Other  negative endocrine ROSneg diabetes  Renal/GU negative Renal ROS     Musculoskeletal negative musculoskeletal ROS (+)   Abdominal (+) + obese,   Peds  Hematology negative hematology ROS (+)   Anesthesia Other Findings Past Medical History: No date: Carpal tunnel syndrome     Comment:  Left Wrist No date: Cholecystitis     Comment:  Requiring Lap Chole (02/28/16)   Reproductive/Obstetrics                             Anesthesia Physical Anesthesia Plan  ASA: II  Anesthesia Plan: General   Post-op Pain Management:    Induction: Intravenous  PONV Risk Score and Plan: 2 and Propofol infusion  Airway Management Planned: Natural Airway  Additional Equipment:   Intra-op Plan:   Post-operative Plan:   Informed Consent: I have reviewed the patients History and Physical, chart, labs and discussed the procedure including the risks, benefits and alternatives for the proposed anesthesia with the patient or authorized representative who has indicated his/her understanding and acceptance.   Dental advisory given  Plan  Discussed with: CRNA and Anesthesiologist  Anesthesia Plan Comments:         Anesthesia Quick Evaluation

## 2016-10-07 NOTE — H&P (Deleted)
   Tracey Lame, MD [address] [place], [place] [address] Phone: [phone] Fax : [phone]  Primary Care Physician:  Malachy Mood, MD Primary Gastroenterologist:  Dr. Allen Norris  Pre-Procedure History & Physical: HPI:  Tracey Watkins is a 46 y.o. female is here for a screening colonoscopy.   Past Medical History:  Diagnosis Date  . Carpal tunnel syndrome    Left Wrist  . Cholecystitis    Requiring Lap Chole (02/28/16)    Past Surgical History:  Procedure Laterality Date  . CHOLECYSTECTOMY N/A 02/27/2016   Procedure: LAPAROSCOPIC CHOLECYSTECTOMY;  Surgeon: Jules Husbands, MD;  Location: [facility] ORS;  Service: General;  Laterality: N/A;  . COLONOSCOPY WITH PROPOFOL N/A 09/16/2016   Procedure: COLONOSCOPY WITH PROPOFOL;  Surgeon: Tracey Lame, MD;  Location: [facility] ENDOSCOPY;  Service: Endoscopy;  Laterality: N/A;  . DIAGNOSTIC LAPAROSCOPY    . TUBAL LIGATION      Prior to Admission medications   Medication Sig Start Date End Date Taking? Authorizing Provider  Na Sulfate-K Sulfate-Mg Sulf (SUPREP BOWEL PREP KIT) 17.5-3.13-1.6 GM/180ML SOLN Take 1 kit by mouth as directed. 09/24/16   Tracey Lame, MD  norethindrone (AYGESTIN) 5 MG tablet Take 1 tablet (5 mg total) by mouth daily. 07/09/16   Malachy Mood, MD  ranitidine (ZANTAC) 150 MG tablet Take 150 mg by mouth 2 (two) times daily.    [provider]    Allergies as of 09/23/2016 - Review Complete 09/16/2016  Allergen Reaction Noted  . Skin adhesives [cyanoacrylate] Rash 02/29/2016    Family History  Problem Relation Age of Onset  . Breast cancer Paternal Grandmother 78  . Healthy Mother   . Healthy Father     Social History   Social History  . Marital status: Married    Spouse name: N/A  . Number of children: N/A  . Years of education: N/A   Occupational History  . Not on file.   Social History Main Topics  . Smoking status: Never Smoker  . Smokeless tobacco: Never Used  . Alcohol use  No  . Drug use: No  . Sexual activity: Yes    Birth control/ protection: Pill   Other Topics Concern  . Not on file   Social History Narrative  . No narrative on file    Review of Systems: See HPI, otherwise negative ROS  Physical Exam: BP 107/73   Pulse 61   Temp 98 F (36.7 C) (Tympanic)   Resp 16   Ht _0  (1.676 m)   Wt 200 lb (90.7 kg)   SpO2 100%   BMI 32.28 kg/m  General:   Alert,  pleasant and cooperative in NAD Head:  Normocephalic and atraumatic. Neck:  Supple; no masses or thyromegaly. Lungs:  Clear throughout to auscultation.    Heart:  Regular rate and rhythm. Abdomen:  Soft, nontender and nondistended. Normal bowel sounds, without guarding, and without rebound.   Neurologic:  Alert and  oriented x4;  grossly normal neurologically.  Impression/Plan: WESTYN Watkins is now here to undergo a screening colonoscopy.  Risks, benefits, and alternatives regarding colonoscopy have been reviewed with the patient.  Questions have been answered.  All parties agreeable.

## 2016-10-07 NOTE — Transfer of Care (Signed)
Immediate Anesthesia Transfer of Care Note  Patient: Tracey Watkins  Procedure(s) Performed: Procedure(s): COLONOSCOPY WITH PROPOFOL (N/A)  Patient Location: Endoscopy Unit  Anesthesia Type:General  Level of Consciousness: drowsy and patient cooperative  Airway & Oxygen Therapy: Patient Spontanous Breathing and Patient connected to nasal cannula oxygen  Post-op Assessment: Report given to RN and Post -op Vital signs reviewed and stable  Post vital signs: Reviewed and stable  Last Vitals:  Vitals:   10/07/16 0857 10/07/16 1006  BP: 107/73 (!) 107/59  Pulse: 61 79  Resp: 16   Temp: 36.7 C (!) 36.1 C  SpO2: 100% 99%    Last Pain:  Vitals:   10/07/16 1006  TempSrc: Tympanic         Complications: No apparent anesthesia complications

## 2016-10-07 NOTE — Anesthesia Postprocedure Evaluation (Signed)
Anesthesia Post Note  Patient: Tracey Watkins  Procedure(s) Performed: Procedure(s) (LRB): COLONOSCOPY WITH PROPOFOL (N/A)  Patient location during evaluation: PACU Anesthesia Type: General Level of consciousness: awake and alert and oriented Pain management: pain level controlled Vital Signs Assessment: post-procedure vital signs reviewed and stable Respiratory status: spontaneous breathing, nonlabored ventilation and respiratory function stable Cardiovascular status: blood pressure returned to baseline and stable Postop Assessment: no signs of nausea or vomiting Anesthetic complications: no     Last Vitals:  Vitals:   10/07/16 1006 10/07/16 1036  BP: (!) 107/59 96/61  Pulse: 79   Resp:    Temp: (!) 36.1 C   SpO2: 99%     Last Pain:  Vitals:   10/07/16 1006  TempSrc: Tympanic                 Nikky Duba

## 2016-10-07 NOTE — Anesthesia Post-op Follow-up Note (Signed)
Anesthesia QCDR form completed.        

## 2016-10-07 NOTE — Op Note (Signed)
[facility] Gastroenterology Patient Name: Tracey Watkins Procedure Date: 10/07/2016 9:36 AM MRN: [id number] Account #: [id number] Date of Birth: 31-Oct-1970 Admit Type: Outpatient Age: 46 Room: [facility] ENDO ROOM 4 Gender: Female Note Status: Finalized Procedure:            Colonoscopy Indications:          Screening for colorectal malignant neoplasm Providers:            Lucilla Lame MD, MD Referring MD:         Stoney Bang. Staebler (Referring MD) Medicines:            Propofol per Anesthesia Complications:        No immediate complications. Procedure:            Pre-Anesthesia Assessment:                       - Prior to the procedure, a History and Physical was                        performed, and patient medications and allergies were                        reviewed. The patient's tolerance of previous                        anesthesia was also reviewed. The risks and benefits of                        the procedure and the sedation options and risks were                        discussed with the patient. All questions were                        answered, and informed consent was obtained. Prior                        Anticoagulants: The patient has taken no previous                        anticoagulant or antiplatelet agents. ASA Grade                        Assessment: II - A patient with mild systemic disease.                        After reviewing the risks and benefits, the patient was                        deemed in satisfactory condition to undergo the                        procedure.                       After obtaining informed consent, the colonoscope was                        passed under direct vision. Throughout the procedure,  the patient's blood pressure, pulse, and oxygen                        saturations were monitored continuously. The Olympus                        CF-H180AL colonoscope ( S#: [id number] ) was introduced                     through the anus and advanced to the the cecum,                        identified by appendiceal orifice and ileocecal valve.                        The colonoscopy was performed without difficulty. The                        patient tolerated the procedure well. The quality of                        the bowel preparation was poor. Findings:      The perianal and digital rectal examinations were normal.      A 10 mm polyp was found in the cecum. The polyp was sessile. Area was       successfully injected with saline for a lift polypectomy. The polyp was       removed with a hot snare. Resection and retrieval were complete. To       prevent bleeding post-intervention, one hemostatic clip was successfully       placed (MR conditional). There was no bleeding at the end of the       procedure.      Copious quantities of semi-liquid stool was found in the entire colon,       interfering with visualization. Impression:           - Preparation of the colon was poor.                       - One 10 mm polyp in the cecum, removed with a hot                        snare. Resected and retrieved. Injected. Clip (MR                        conditional) was placed.                       - Stool in the entire examined colon. Recommendation:       - Discharge patient to home.                       - Resume previous diet.                       - Continue present medications.                       - Await pathology results.                       - Repeat colonoscopy in  1 year for surveillance. Procedure Code(s):    --- Professional ---                       [phone], Colonoscopy, flexible; with removal of tumor(s),                        polyp(s), or other lesion(s) by snare technique                       [id number], Colonoscopy, flexible; with directed submucosal                        injection(s), any substance Diagnosis Code(s):    --- Professional ---                       Z12.11,  Encounter for screening for malignant neoplasm                        of colon                       D12.0, Benign neoplasm of cecum CPT copyright 2016 American Medical Association. All rights reserved. The codes documented in this report are preliminary and upon coder review may  be revised to meet current compliance requirements. Lucilla Lame MD, MD 10/07/2016 10:04:05 AM This report has been signed electronically. Number of Addenda: 0 Note Initiated On: 10/07/2016 9:36 AM Scope Withdrawal Time: 0 hours 10 minutes 17 seconds  Total Procedure Duration: 0 hours 14 minutes 40 seconds       [facility]

## 2016-10-08 ENCOUNTER — Encounter: Payer: Self-pay | Admitting: Gastroenterology

## 2016-10-08 LAB — SURGICAL PATHOLOGY

## 2016-10-09 ENCOUNTER — Encounter: Payer: Self-pay | Admitting: Gastroenterology

## 2016-11-10 ENCOUNTER — Encounter: Payer: Self-pay | Admitting: Obstetrics and Gynecology

## 2016-11-18 ENCOUNTER — Encounter: Payer: Self-pay | Admitting: Obstetrics and Gynecology

## 2016-11-18 DIAGNOSIS — D127 Benign neoplasm of rectosigmoid junction: Secondary | ICD-10-CM | POA: Insufficient documentation

## 2016-11-18 DIAGNOSIS — D126 Benign neoplasm of colon, unspecified: Secondary | ICD-10-CM | POA: Insufficient documentation

## 2017-03-26 DIAGNOSIS — M79671 Pain in right foot: Secondary | ICD-10-CM | POA: Diagnosis not present

## 2017-03-26 DIAGNOSIS — M722 Plantar fascial fibromatosis: Secondary | ICD-10-CM | POA: Diagnosis not present

## 2017-03-26 DIAGNOSIS — M79672 Pain in left foot: Secondary | ICD-10-CM | POA: Diagnosis not present

## 2017-04-01 DIAGNOSIS — L600 Ingrowing nail: Secondary | ICD-10-CM | POA: Diagnosis not present

## 2017-04-14 DIAGNOSIS — L600 Ingrowing nail: Secondary | ICD-10-CM | POA: Diagnosis not present

## 2017-04-14 DIAGNOSIS — M722 Plantar fascial fibromatosis: Secondary | ICD-10-CM | POA: Diagnosis not present

## 2017-05-05 DIAGNOSIS — M722 Plantar fascial fibromatosis: Secondary | ICD-10-CM | POA: Diagnosis not present

## 2017-05-05 DIAGNOSIS — M79672 Pain in left foot: Secondary | ICD-10-CM | POA: Diagnosis not present

## 2017-07-03 ENCOUNTER — Encounter: Payer: Self-pay | Admitting: Obstetrics and Gynecology

## 2017-07-09 NOTE — Progress Notes (Signed)
Gynecology Annual Exam  PCP: Malachy Mood, MD  Chief Complaint: No chief complaint on file.   History of Present Illness: Patient is a 47 y.o. G3P3 presents for annual exam. The patient has no complaints today.   LMP: No LMP recorded. (Menstrual status: Oral contraceptives). Amenorrhea on norethindrone for cycle control and dysmenorrhea suspected endometriosis   The patient is sexually active. She currently uses tubal ligation for contraception. She denies dyspareunia.  The patient does perform self breast exams.  There is no notable family history of breast or ovarian cancer in her family (Only case paternal grandmother 53's).  The patient wears seatbelts: yes.   The patient has regular exercise: not asked.    The patient denies current symptoms of depression.    Review of Systems: Review of Systems  Constitutional: Negative for chills and fever.  HENT: Negative for congestion.   Respiratory: Negative for cough and shortness of breath.   Cardiovascular: Negative for chest pain and palpitations.  Gastrointestinal: Negative for abdominal pain, constipation, diarrhea, heartburn, nausea and vomiting.  Genitourinary: Negative for dysuria, frequency and urgency.  Musculoskeletal: Negative.   Skin: Negative for itching and rash.  Neurological: Negative for dizziness and headaches.  Endo/Heme/Allergies: Positive for environmental allergies. Negative for polydipsia.  Psychiatric/Behavioral: Negative for depression.    Past Medical History:  Past Medical History:  Diagnosis Date  . Carpal tunnel syndrome    Left Wrist  . Cholecystitis    Requiring Lap Chole (02/28/16)  . Endometriosis     Past Surgical History:  Past Surgical History:  Procedure Laterality Date  . CHOLECYSTECTOMY N/A 02/27/2016   Procedure: LAPAROSCOPIC CHOLECYSTECTOMY;  Surgeon: Jules Husbands, MD;  Location: [facility] ORS;  Service: General;  Laterality: N/A;  . COLONOSCOPY WITH PROPOFOL N/A 09/16/2016   Procedure: COLONOSCOPY WITH PROPOFOL;  Surgeon: Lucilla Lame, MD;  Location: [facility] ENDOSCOPY;  Service: Endoscopy;  Laterality: N/A;  . COLONOSCOPY WITH PROPOFOL N/A 10/07/2016   Procedure: COLONOSCOPY WITH PROPOFOL;  Surgeon: Lucilla Lame, MD;  Location: [facility] ENDOSCOPY;  Service: Endoscopy;  Laterality: N/A;  . DIAGNOSTIC LAPAROSCOPY    . TUBAL LIGATION      Gynecologic History:  No LMP recorded. (Menstrual status: Oral contraceptives). Contraception: tubal ligation Last Pap: Results were: 07/04/2014 NIL and HR HPV negative  Last mammogram: 07/24/2016 Results were: BI-RAD I  Obstetric History: G3P3  Family History:  Family History  Problem Relation Age of Onset  . Breast cancer Paternal Grandmother 23  . Healthy Mother   . Healthy Father     Social History:  Social History   Socioeconomic History  . Marital status: Married    Spouse name: Not on file  . Number of children: Not on file  . Years of education: Not on file  . Highest education level: Not on file  Occupational History  . Not on file  Social Needs  . Financial resource strain: Not on file  . Food insecurity:    Worry: Not on file    Inability: Not on file  . Transportation needs:    Medical: Not on file    Non-medical: Not on file  Tobacco Use  . Smoking status: Never Smoker  . Smokeless tobacco: Never Used  Substance and Sexual Activity  . Alcohol use: No  . Drug use: No  . Sexual activity: Yes    Birth control/protection: Pill  Lifestyle  . Physical activity:    Days per week: Not on file    Minutes per session:  Not on file  . Stress: Not on file  Relationships  . Social connections:    Talks on phone: Not on file    Gets together: Not on file    Attends religious service: Not on file    Active member of club or organization: Not on file    Attends meetings of clubs or organizations: Not on file    Relationship status: Not on file  . Intimate partner violence:    Fear of current or ex  partner: Not on file    Emotionally abused: Not on file    Physically abused: Not on file    Forced sexual activity: Not on file  Other Topics Concern  . Not on file  Social History Narrative  . Not on file    Allergies:  Allergies  Allergen Reactions  . Skin Adhesives [Cyanoacrylate] Rash    "Dermabond"    Medications: Prior to Admission medications   Medication Sig Start Date End Date Taking? Authorizing Provider  Na Sulfate-K Sulfate-Mg Sulf (SUPREP BOWEL PREP KIT) 17.5-3.13-1.6 GM/180ML SOLN Take 1 kit by mouth as directed. 09/24/16   Lucilla Lame, MD  norethindrone (AYGESTIN) 5 MG tablet Take 1 tablet (5 mg total) by mouth daily. 07/09/16   Malachy Mood, MD  ranitidine (ZANTAC) 150 MG tablet Take 150 mg by mouth 2 (two) times daily.    [provider]    Physical Exam Blood pressure 112/78, pulse 72, height '5\' 6"'$  (1.676 m), weight 212 lb (96.2 kg), SpO2 98 %. No LMP recorded. (Menstrual status: Oral contraceptives).  General: NAD HEENT: normocephalic, anicteric Thyroid: no enlargement, no palpable nodules Pulmonary: No increased work of breathing, CTAB Cardiovascular: RRR, distal pulses 2+ Breast: Breast symmetrical, no tenderness, no palpable nodules or masses, no skin or nipple retraction present, no nipple discharge.  No axillary or supraclavicular lymphadenopathy. Abdomen: NABS, soft, non-tender, non-distended.  Umbilicus without lesions.  No hepatomegaly, splenomegaly or masses palpable. No evidence of hernia  Genitourinary:  External: Normal external female genitalia.  Normal urethral meatus, normal Bartholin's and Skene's glands.    Vagina: Normal vaginal mucosa, no evidence of prolapse.    Cervix: Grossly normal in appearance, no bleeding  Uterus: Non-enlarged, mobile, normal contour.  No CMT  Adnexa: ovaries non-enlarged, no adnexal masses  Rectal: deferred  Lymphatic: no evidence of inguinal lymphadenopathy Extremities: no edema, erythema, or  tenderness Neurologic: Grossly intact Psychiatric: mood appropriate, affect full  Female chaperone present for pelvic and breast  portions of the physical exam    Assessment: 47 y.o. G3P3 routine annual exam  Plan: Problem List Items Addressed This Visit    None    Visit Diagnoses    Encounter for gynecological examination without abnormal finding    -  Primary   Relevant Orders   PapIG, HPV, rfx 16/18   Screening for malignant neoplasm of cervix       Relevant Orders   PapIG, HPV, rfx 16/18   Breast screening       Relevant Orders   MM DIGITAL SCREENING BILATERAL   Adenomatous polyp of colon, unspecified part of colon       Relevant Orders   Ambulatory referral to Gastroenterology      1) Mammogram - recommend yearly screening mammogram.  Mammogram Was ordered today   2) STI screening  was notoffered and therefore not obtained  3) ASCCP guidelines and rational discussed.  Patient opts for every 3 years screening interval  4) Contraception - the patient is  currently using  tubal ligation.  Also on norethindrone for treatment of presumptive endometriosis  5) Colonoscopy - 10/07/2016 Dr. Allen Norris had adenomatous polyp with 1 year follow up recommended per patient  6) Routine healthcare maintenance including cholesterol, diabetes screening discussed managed by PCP  7) Return in about 1 year (around 07/11/2018) for annual.   Malachy Mood, MD, [place], [facility] 07/10/2017, 9:04 AM

## 2017-07-10 ENCOUNTER — Encounter: Payer: Self-pay | Admitting: Obstetrics and Gynecology

## 2017-07-10 ENCOUNTER — Ambulatory Visit (INDEPENDENT_AMBULATORY_CARE_PROVIDER_SITE_OTHER): Payer: BLUE CROSS/BLUE SHIELD | Admitting: Obstetrics and Gynecology

## 2017-07-10 VITALS — BP 112/78 | HR 72 | Ht 66.0 in | Wt 212.0 lb

## 2017-07-10 DIAGNOSIS — Z01419 Encounter for gynecological examination (general) (routine) without abnormal findings: Secondary | ICD-10-CM

## 2017-07-10 DIAGNOSIS — Z124 Encounter for screening for malignant neoplasm of cervix: Secondary | ICD-10-CM

## 2017-07-10 DIAGNOSIS — Z1231 Encounter for screening mammogram for malignant neoplasm of breast: Secondary | ICD-10-CM | POA: Diagnosis not present

## 2017-07-10 DIAGNOSIS — D126 Benign neoplasm of colon, unspecified: Secondary | ICD-10-CM

## 2017-07-10 DIAGNOSIS — Z01411 Encounter for gynecological examination (general) (routine) with abnormal findings: Secondary | ICD-10-CM | POA: Diagnosis not present

## 2017-07-10 DIAGNOSIS — Z1239 Encounter for other screening for malignant neoplasm of breast: Secondary | ICD-10-CM

## 2017-07-10 MED ORDER — NORETHINDRONE ACETATE 5 MG PO TABS: 5.0000 mg | ORAL_TABLET | Freq: Every day | ORAL | 11 refills | Status: DC

## 2017-07-10 NOTE — Patient Instructions (Signed)
[facility] [address] [place] [address]  [facility] [place]  [address] [place] [address]  Phone: [phone]  

## 2017-07-15 LAB — PAPIG, HPV, RFX 16/18
HPV, high-risk: NEGATIVE
PAP SMEAR COMMENT: 0

## 2017-08-04 ENCOUNTER — Encounter: Payer: Self-pay | Admitting: *Deleted

## 2017-08-14 ENCOUNTER — Ambulatory Visit
Admission: RE | Admit: 2017-08-14 | Discharge: 2017-08-14 | Disposition: A | Discharge status: Home / Self Care | Payer: BLUE CROSS/BLUE SHIELD | Source: Ambulatory Visit | Attending: Obstetrics and Gynecology | Admitting: Obstetrics and Gynecology

## 2017-08-14 DIAGNOSIS — Z1231 Encounter for screening mammogram for malignant neoplasm of breast: Secondary | ICD-10-CM | POA: Insufficient documentation

## 2017-08-14 DIAGNOSIS — Z1239 Encounter for other screening for malignant neoplasm of breast: Secondary | ICD-10-CM

## 2017-08-15 ENCOUNTER — Encounter (INDEPENDENT_AMBULATORY_CARE_PROVIDER_SITE_OTHER): Payer: Self-pay

## 2017-08-17 ENCOUNTER — Other Ambulatory Visit: Payer: Self-pay

## 2017-08-17 DIAGNOSIS — Z1211 Encounter for screening for malignant neoplasm of colon: Secondary | ICD-10-CM

## 2017-08-19 DIAGNOSIS — M722 Plantar fascial fibromatosis: Secondary | ICD-10-CM | POA: Diagnosis not present

## 2017-08-19 DIAGNOSIS — M79672 Pain in left foot: Secondary | ICD-10-CM | POA: Diagnosis not present

## 2017-09-09 DIAGNOSIS — M722 Plantar fascial fibromatosis: Secondary | ICD-10-CM | POA: Diagnosis not present

## 2017-09-09 DIAGNOSIS — M79672 Pain in left foot: Secondary | ICD-10-CM | POA: Diagnosis not present

## 2017-09-28 ENCOUNTER — Other Ambulatory Visit: Payer: Self-pay | Admitting: Podiatry

## 2017-09-28 DIAGNOSIS — M79672 Pain in left foot: Secondary | ICD-10-CM | POA: Diagnosis not present

## 2017-09-28 DIAGNOSIS — M722 Plantar fascial fibromatosis: Secondary | ICD-10-CM | POA: Diagnosis not present

## 2017-09-29 ENCOUNTER — Ambulatory Visit: Payer: BLUE CROSS/BLUE SHIELD | Admitting: Anesthesiology

## 2017-09-29 ENCOUNTER — Encounter: Payer: Self-pay | Admitting: Anesthesiology

## 2017-09-29 ENCOUNTER — Encounter
Admission: RE | Disposition: A | Discharge status: Home / Self Care | Payer: Self-pay | Source: Ambulatory Visit | Attending: Gastroenterology

## 2017-09-29 ENCOUNTER — Ambulatory Visit
Admission: RE | Admit: 2017-09-29 | Discharge: 2017-09-29 | Disposition: A | Discharge status: Home / Self Care | Payer: BLUE CROSS/BLUE SHIELD | Source: Ambulatory Visit | Attending: Gastroenterology | Admitting: Gastroenterology

## 2017-09-29 DIAGNOSIS — K635 Polyp of colon: Secondary | ICD-10-CM | POA: Diagnosis not present

## 2017-09-29 DIAGNOSIS — Z9049 Acquired absence of other specified parts of digestive tract: Secondary | ICD-10-CM | POA: Diagnosis not present

## 2017-09-29 DIAGNOSIS — D126 Benign neoplasm of colon, unspecified: Secondary | ICD-10-CM | POA: Diagnosis not present

## 2017-09-29 DIAGNOSIS — Z860100 Personal history of colon polyps, unspecified: Secondary | ICD-10-CM

## 2017-09-29 DIAGNOSIS — Z1211 Encounter for screening for malignant neoplasm of colon: Secondary | ICD-10-CM

## 2017-09-29 DIAGNOSIS — G5602 Carpal tunnel syndrome, left upper limb: Secondary | ICD-10-CM | POA: Diagnosis not present

## 2017-09-29 DIAGNOSIS — N809 Endometriosis, unspecified: Secondary | ICD-10-CM | POA: Diagnosis not present

## 2017-09-29 DIAGNOSIS — Z79899 Other long term (current) drug therapy: Secondary | ICD-10-CM | POA: Insufficient documentation

## 2017-09-29 DIAGNOSIS — D120 Benign neoplasm of cecum: Secondary | ICD-10-CM | POA: Insufficient documentation

## 2017-09-29 DIAGNOSIS — Z803 Family history of malignant neoplasm of breast: Secondary | ICD-10-CM | POA: Diagnosis not present

## 2017-09-29 DIAGNOSIS — Z91048 Other nonmedicinal substance allergy status: Secondary | ICD-10-CM | POA: Diagnosis not present

## 2017-09-29 HISTORY — PX: COLONOSCOPY WITH PROPOFOL: SHX5780

## 2017-09-29 LAB — POCT PREGNANCY, URINE: PREG TEST UR: NEGATIVE

## 2017-09-29 SURGERY — COLONOSCOPY WITH PROPOFOL
Anesthesia: General

## 2017-09-29 MED ORDER — EPHEDRINE SULFATE 50 MG/ML IJ SOLN
INTRAMUSCULAR | Status: AC
  Filled 2017-09-29: qty 1

## 2017-09-29 MED ORDER — PROPOFOL 500 MG/50ML IV EMUL
INTRAVENOUS | Status: DC | PRN
  Administered 2017-09-29: 120 ug/kg/min via INTRAVENOUS

## 2017-09-29 MED ORDER — PROPOFOL 10 MG/ML IV BOLUS
INTRAVENOUS | Status: DC | PRN
  Administered 2017-09-29: 90 mg via INTRAVENOUS

## 2017-09-29 MED ORDER — PROPOFOL 500 MG/50ML IV EMUL
INTRAVENOUS | Status: AC
  Filled 2017-09-29: qty 50

## 2017-09-29 MED ORDER — PHENYLEPHRINE HCL 10 MG/ML IJ SOLN
INTRAMUSCULAR | Status: AC
  Filled 2017-09-29: qty 1

## 2017-09-29 MED ORDER — PROPOFOL 10 MG/ML IV BOLUS
INTRAVENOUS | Status: AC
  Filled 2017-09-29: qty 20

## 2017-09-29 MED ORDER — LIDOCAINE HCL (PF) 1 % IJ SOLN
INTRAMUSCULAR | Status: AC
  Administered 2017-09-29: 0.3 mL
  Filled 2017-09-29: qty 2

## 2017-09-29 MED ORDER — MIDAZOLAM HCL 2 MG/2ML IJ SOLN
INTRAMUSCULAR | Status: DC | PRN
  Administered 2017-09-29: 2 mg via INTRAVENOUS

## 2017-09-29 MED ORDER — SODIUM CHLORIDE 0.9 % IV SOLN
INTRAVENOUS | Status: DC
  Administered 2017-09-29: 1000 mL via INTRAVENOUS

## 2017-09-29 MED ORDER — MIDAZOLAM HCL 2 MG/2ML IJ SOLN
INTRAMUSCULAR | Status: AC
  Filled 2017-09-29: qty 2

## 2017-09-29 NOTE — Anesthesia Postprocedure Evaluation (Signed)
Anesthesia Post Note  Patient: Tracey Watkins  Procedure(s) Performed: COLONOSCOPY WITH PROPOFOL (N/A )  Patient location during evaluation: Endoscopy Anesthesia Type: General Level of consciousness: awake and alert Pain management: pain level controlled Vital Signs Assessment: post-procedure vital signs reviewed and stable Respiratory status: spontaneous breathing, nonlabored ventilation, respiratory function stable and patient connected to nasal cannula oxygen Cardiovascular status: blood pressure returned to baseline and stable Postop Assessment: no apparent nausea or vomiting Anesthetic complications: no     Last Vitals:  Vitals:   09/29/17 0850 09/29/17 0900  BP: (!) 90/56 90/69  Pulse: (!) 57 (!) 57  Resp: 13 17  Temp:    SpO2: 100% 100%    Last Pain:  Vitals:   09/29/17 0900  TempSrc:   PainSc: 0-No pain                 Precious Haws Piscitello

## 2017-09-29 NOTE — Anesthesia Post-op Follow-up Note (Signed)
Anesthesia QCDR form completed.        

## 2017-09-29 NOTE — Transfer of Care (Signed)
Immediate Anesthesia Transfer of Care Note  Patient: STELA IWASAKI  Procedure(s) Performed: COLONOSCOPY WITH PROPOFOL (N/A )  Patient Location: Endoscopy Unit  Anesthesia Type:General  Level of Consciousness: drowsy and patient cooperative  Airway & Oxygen Therapy: Patient Spontanous Breathing and Patient connected to nasal cannula oxygen  Post-op Assessment: Report given to RN and Post -op Vital signs reviewed and stable  Post vital signs: Reviewed and stable  Last Vitals:  Vitals Value Taken Time  BP 101/60 09/29/2017  8:31 AM  Temp 35.9 C 09/29/2017  8:30 AM  Pulse 63 09/29/2017  8:32 AM  Resp 11 09/29/2017  8:32 AM  SpO2 100 % 09/29/2017  8:32 AM  Vitals shown include unvalidated device data.  Last Pain:  Vitals:   09/29/17 0742  TempSrc: Tympanic  PainSc: 0-No pain         Complications: No apparent anesthesia complications

## 2017-09-29 NOTE — Op Note (Signed)
[facility] Gastroenterology Patient Name: Tracey Watkins Procedure Date: 09/29/2017 7:59 AM MRN: [id number] Account #: [id number] Date of Birth: Jun 15, 1970 Admit Type: Outpatient Age: 47 Room: [facility] ENDO ROOM 4 Gender: Female Note Status: Finalized Procedure:            Colonoscopy Indications:          High risk colon cancer surveillance: Personal history                        of colonic polyps Providers:            Lucilla Lame MD, MD Referring MD:         Stoney Bang. Staebler (Referring MD) Medicines:            Propofol per Anesthesia Complications:        No immediate complications. Procedure:            Pre-Anesthesia Assessment:                       - Prior to the procedure, a History and Physical was                        performed, and patient medications and allergies were                        reviewed. The patient's tolerance of previous                        anesthesia was also reviewed. The risks and benefits of                        the procedure and the sedation options and risks were                        discussed with the patient. All questions were                        answered, and informed consent was obtained. Prior                        Anticoagulants: The patient has taken no previous                        anticoagulant or antiplatelet agents. ASA Grade                        Assessment: II - A patient with mild systemic disease.                        After reviewing the risks and benefits, the patient was                        deemed in satisfactory condition to undergo the                        procedure.                       After obtaining informed consent, the colonoscope was  passed under direct vision. Throughout the procedure,                        the patient's blood pressure, pulse, and oxygen                        saturations were monitored continuously. The                        Colonoscope  was introduced through the anus and                        advanced to the the cecum, identified by appendiceal                        orifice and ileocecal valve. The colonoscopy was                        performed without difficulty. The patient tolerated the                        procedure well. The quality of the bowel preparation                        was good. Findings:      The perianal and digital rectal examinations were normal.      A 2 mm polyp was found in the cecum. The polyp was sessile. The polyp       was removed with a cold biopsy forceps. Resection and retrieval were       complete. Impression:           - One 2 mm polyp in the cecum, removed with a cold                        biopsy forceps. Resected and retrieved. Recommendation:       - Discharge patient to home.                       - Resume previous diet.                       - Continue present medications.                       - Await pathology results.                       - Repeat colonoscopy in 5 years for surveillance. Procedure Code(s):    --- Professional ---                       [phone], Colonoscopy, flexible; with biopsy, single or                        multiple Diagnosis Code(s):    --- Professional ---                       Z86.010, Personal history of colonic polyps                       D12.0, Benign neoplasm of cecum CPT copyright 2017 American  Medical Association. All rights reserved. The codes documented in this report are preliminary and upon coder review may  be revised to meet current compliance requirements. Lucilla Lame MD, MD 09/29/2017 8:27:45 AM This report has been signed electronically. Number of Addenda: 0 Note Initiated On: 09/29/2017 7:59 AM Scope Withdrawal Time: 0 hours 9 minutes 50 seconds  Total Procedure Duration: 0 hours 14 minutes 46 seconds       [facility]

## 2017-09-29 NOTE — H&P (Signed)
Lucilla Lame, MD [address] [place], [place] [address] Phone:[phone] Fax : [phone]  Primary Care Physician:  Malachy Mood, MD Primary Gastroenterologist:  Dr. Allen Norris  Pre-Procedure History & Physical: HPI:  Tracey Watkins is a 47 y.o. female is here for an colonoscopy.   Past Medical History:  Diagnosis Date  . Carpal tunnel syndrome    Left Wrist  . Cholecystitis    Requiring Lap Chole (02/28/16)  . Endometriosis     Past Surgical History:  Procedure Laterality Date  . CHOLECYSTECTOMY N/A 02/27/2016   Procedure: LAPAROSCOPIC CHOLECYSTECTOMY;  Surgeon: Jules Husbands, MD;  Location: [facility] ORS;  Service: General;  Laterality: N/A;  . COLONOSCOPY WITH PROPOFOL N/A 09/16/2016   Procedure: COLONOSCOPY WITH PROPOFOL;  Surgeon: Lucilla Lame, MD;  Location: [facility] ENDOSCOPY;  Service: Endoscopy;  Laterality: N/A;  . COLONOSCOPY WITH PROPOFOL N/A 10/07/2016   Procedure: COLONOSCOPY WITH PROPOFOL;  Surgeon: Lucilla Lame, MD;  Location: [facility] ENDOSCOPY;  Service: Endoscopy;  Laterality: N/A;  . DIAGNOSTIC LAPAROSCOPY    . TUBAL LIGATION      Prior to Admission medications   Medication Sig Start Date End Date Taking? Authorizing Provider  norethindrone (AYGESTIN) 5 MG tablet Take 1 tablet (5 mg total) by mouth daily. 07/10/17   Malachy Mood, MD  ranitidine (ZANTAC) 150 MG tablet Take 150 mg by mouth 2 (two) times daily.    [provider]    Allergies as of 08/17/2017 - Review Complete 10/07/2016  Allergen Reaction Noted  . Skin adhesives [cyanoacrylate] Rash 02/29/2016    Family History  Problem Relation Age of Onset  . Breast cancer Paternal Grandmother 60  . Healthy Mother   . Healthy Father     Social History   Socioeconomic History  . Marital status: Married    Spouse name: Not on file  . Number of children: Not on file  . Years of education: Not on file  . Highest education level: Not on file  Occupational History  . Not on file    Social Needs  . Financial resource strain: Not on file  . Food insecurity:    Worry: Not on file    Inability: Not on file  . Transportation needs:    Medical: Not on file    Non-medical: Not on file  Tobacco Use  . Smoking status: Never Smoker  . Smokeless tobacco: Never Used  Substance and Sexual Activity  . Alcohol use: No  . Drug use: No  . Sexual activity: Yes    Birth control/protection: Pill  Lifestyle  . Physical activity:    Days per week: Not on file    Minutes per session: Not on file  . Stress: Not on file  Relationships  . Social connections:    Talks on phone: Not on file    Gets together: Not on file    Attends religious service: Not on file    Active member of club or organization: Not on file    Attends meetings of clubs or organizations: Not on file    Relationship status: Not on file  . Intimate partner violence:    Fear of current or ex partner: Not on file    Emotionally abused: Not on file    Physically abused: Not on file    Forced sexual activity: Not on file  Other Topics Concern  . Not on file  Social History Narrative  . Not on file    Review of Systems:  See HPI, otherwise negative ROS  Physical Exam: BP 105/77   Pulse 65   Temp (!) 96.2 F (35.7 C) (Tympanic)   Resp 17   Ht 5\' 6"  (1.676 m)   Wt 90.7 kg   SpO2 100%   BMI 32.28 kg/m  General:   Alert,  pleasant and cooperative in NAD Head:  Normocephalic and atraumatic. Neck:  Supple; no masses or thyromegaly. Lungs:  Clear throughout to auscultation.    Heart:  Regular rate and rhythm. Abdomen:  Soft, nontender and nondistended. Normal bowel sounds, without guarding, and without rebound.   Neurologic:  Alert and  oriented x4;  grossly normal neurologically.  Impression/Plan: Worthy Keeler is here for an colonoscopy to be performed for history of colon polyps  Risks, benefits, limitations, and alternatives regarding  colonoscopy have been reviewed with the patient.  Questions  have been answered.  All parties agreeable.   Lucilla Lame, MD  09/29/2017, 7:59 AM

## 2017-09-29 NOTE — Anesthesia Preprocedure Evaluation (Signed)
Anesthesia Evaluation  Patient identified by MRN, date of birth, ID band Patient awake    Reviewed: Allergy & Precautions, H&P , NPO status , Patient's Chart, lab work & pertinent test results  History of Anesthesia Complications Negative for: history of anesthetic complications  Airway Mallampati: III  TM Distance: <3 FB Neck ROM: full    Dental  (+) Chipped   Pulmonary neg pulmonary ROS, neg shortness of breath,           Cardiovascular Exercise Tolerance: Good (-) angina(-) Past MI and (-) DOE negative cardio ROS       Neuro/Psych  Neuromuscular disease negative psych ROS   GI/Hepatic negative GI ROS, Neg liver ROS, neg GERD  ,  Endo/Other  negative endocrine ROS  Renal/GU negative Renal ROS  negative genitourinary   Musculoskeletal   Abdominal   Peds  Hematology negative hematology ROS (+)   Anesthesia Other Findings Past Medical History: No date: Carpal tunnel syndrome     Comment:  Left Wrist No date: Cholecystitis     Comment:  Requiring Lap Chole (02/28/16) No date: Endometriosis  Past Surgical History: 02/27/2016: CHOLECYSTECTOMY; N/A     Comment:  Procedure: LAPAROSCOPIC CHOLECYSTECTOMY;  Surgeon: Jules Husbands, MD;  Location: [facility] ORS;  Service: General;                Laterality: N/A; 09/16/2016: COLONOSCOPY WITH PROPOFOL; N/A     Comment:  Procedure: COLONOSCOPY WITH PROPOFOL;  Surgeon: Lucilla Lame, MD;  Location: [facility] ENDOSCOPY;  Service:               Endoscopy;  Laterality: N/A; 10/07/2016: COLONOSCOPY WITH PROPOFOL; N/A     Comment:  Procedure: COLONOSCOPY WITH PROPOFOL;  Surgeon: Lucilla Lame, MD;  Location: [facility] ENDOSCOPY;  Service:               Endoscopy;  Laterality: N/A; No date: DIAGNOSTIC LAPAROSCOPY No date: TUBAL LIGATION  BMI    Body Mass Index:  32.28 kg/m      Reproductive/Obstetrics negative OB ROS                              Anesthesia Physical Anesthesia Plan  ASA: III  Anesthesia Plan: General   Post-op Pain Management:    Induction: Intravenous  PONV Risk Score and Plan: Propofol infusion and TIVA  Airway Management Planned: Natural Airway and Nasal Cannula  Additional Equipment:   Intra-op Plan:   Post-operative Plan:   Informed Consent: I have reviewed the patients History and Physical, chart, labs and discussed the procedure including the risks, benefits and alternatives for the proposed anesthesia with the patient or authorized representative who has indicated his/her understanding and acceptance.   Dental Advisory Given  Plan Discussed with: Anesthesiologist, CRNA and Surgeon  Anesthesia Plan Comments: (Patient consented for risks of anesthesia including but not limited to:  - adverse reactions to medications - risk of intubation if required - damage to teeth, lips or other oral mucosa - sore throat or hoarseness - Damage to heart, brain, lungs or loss of life  Patient voiced understanding.)        Anesthesia Quick Evaluation

## 2017-10-01 ENCOUNTER — Encounter: Payer: Self-pay | Admitting: *Deleted

## 2017-10-01 ENCOUNTER — Other Ambulatory Visit: Payer: Self-pay

## 2017-10-01 NOTE — Discharge Instructions (Signed)
[facility] °[facility] ° °POST OPERATIVE INSTRUCTIONS FOR DR. TROXLER AND DR. FOWLER °[facility] ° ° °1. Take your medication as prescribed.  Pain medication should be taken only as needed. ° °2. Keep the dressing clean, dry and intact. ° °3. Keep your foot elevated above the heart level for the first 48 hours. ° °4. Walking to the bathroom and brief periods of walking are acceptable, unless we have instructed you to be non-weight bearing. ° °5. Always wear your post-op shoe when walking.  Always use your crutches if you are to be non-weight bearing. ° °6. Do not take a shower. Baths are permissible as long as the foot is kept out of the water.  ° °7. Every hour you are awake:  °- Bend your knee 15 times. °- Flex foot 15 times °- Massage calf 15 times ° °8. Call [facility] ([phone]) if any of the following problems occur: °- You develop a temperature or fever. °- The bandage becomes saturated with blood. °- Medication does not stop your pain. °- Injury of the foot occurs. °- Any symptoms of infection including redness, odor, or red streaks running from wound. ° ° °General Anesthesia, Adult, Care After °These instructions provide you with information about caring for yourself after your procedure. Your health care provider may also give you more specific instructions. Your treatment has been planned according to current medical practices, but problems sometimes occur. Call your health care provider if you have any problems or questions after your procedure. °What can I expect after the procedure? °After the procedure, it is common to have: °· Vomiting. °· A sore throat. °· Mental slowness. ° °It is common to feel: °· Nauseous. °· Cold or shivery. °· Sleepy. °· Tired. °· Sore or achy, even in parts of your body where you did not have surgery. ° °Follow these instructions at home: °For at least 24 hours after the procedure: °· Do not: °? Participate in  activities where you could fall or become injured. °? Drive. °? Use heavy machinery. °? Drink alcohol. °? Take sleeping pills or medicines that cause drowsiness. °? Make important decisions or sign legal documents. °? Take care of children on your own. °· Rest. °Eating and drinking °· If you vomit, drink water, juice, or soup when you can drink without vomiting. °· Drink enough fluid to keep your urine clear or pale yellow. °· Make sure you have little or no nausea before eating solid foods. °· Follow the diet recommended by your health care provider. °General instructions °· Have a responsible adult stay with you until you are awake and alert. °· Return to your normal activities as told by your health care provider. Ask your health care provider what activities are safe for you. °· Take over-the-counter and prescription medicines only as told by your health care provider. °· If you smoke, do not smoke without supervision. °· Keep all follow-up visits as told by your health care provider. This is important. °Contact a health care provider if: °· You continue to have nausea or vomiting at home, and medicines are not helpful. °· You cannot drink fluids or start eating again. °· You cannot urinate after 8-12 hours. °· You develop a skin rash. °· You have fever. °· You have increasing redness at the site of your procedure. °Get help right away if: °· You have difficulty breathing. °· You have chest pain. °· You have unexpected bleeding. °· You feel that you   are having a life-threatening or urgent problem. °This information is not intended to replace advice given to you by your health care provider. Make sure you discuss any questions you have with your health care provider. °Document Released: 04/07/2000 Document Revised: 06/04/2015 Document Reviewed: 12/14/2014 °Elsevier Interactive Patient Education © 2018 [employer] ° °

## 2017-10-02 LAB — SURGICAL PATHOLOGY

## 2017-10-05 ENCOUNTER — Encounter: Payer: Self-pay | Admitting: Gastroenterology

## 2017-10-08 ENCOUNTER — Ambulatory Visit: Payer: BLUE CROSS/BLUE SHIELD | Admitting: Anesthesiology

## 2017-10-08 ENCOUNTER — Encounter
Admission: RE | Disposition: A | Discharge status: Home / Self Care | Payer: Self-pay | Source: Ambulatory Visit | Attending: Podiatry

## 2017-10-08 ENCOUNTER — Ambulatory Visit
Admission: RE | Admit: 2017-10-08 | Discharge: 2017-10-08 | Disposition: A | Discharge status: Home / Self Care | Payer: BLUE CROSS/BLUE SHIELD | Source: Ambulatory Visit | Attending: Podiatry | Admitting: Podiatry

## 2017-10-08 DIAGNOSIS — Z793 Long term (current) use of hormonal contraceptives: Secondary | ICD-10-CM | POA: Diagnosis not present

## 2017-10-08 DIAGNOSIS — M79672 Pain in left foot: Secondary | ICD-10-CM | POA: Diagnosis not present

## 2017-10-08 DIAGNOSIS — M722 Plantar fascial fibromatosis: Secondary | ICD-10-CM | POA: Diagnosis not present

## 2017-10-08 HISTORY — PX: PLANTAR FASCIA RELEASE: SHX2239

## 2017-10-08 HISTORY — DX: Gastro-esophageal reflux disease without esophagitis: K21.9

## 2017-10-08 SURGERY — RELEASE, FASCIA, PLANTAR
Anesthesia: General | Site: Foot | Laterality: Left

## 2017-10-08 MED ORDER — OXYCODONE-ACETAMINOPHEN 7.5-325 MG PO TABS: 1.0000 | ORAL_TABLET | ORAL | 0 refills | Status: DC | PRN

## 2017-10-08 MED ORDER — OXYCODONE HCL 5 MG/5ML PO SOLN: 5.0000 mg | Freq: Once | ORAL | Status: DC | PRN

## 2017-10-08 MED ORDER — OXYCODONE HCL 5 MG PO TABS: 5.0000 mg | ORAL_TABLET | Freq: Once | ORAL | Status: DC | PRN

## 2017-10-08 MED ORDER — PROMETHAZINE HCL 25 MG/ML IJ SOLN: 6.2500 mg | INTRAMUSCULAR | Status: DC | PRN

## 2017-10-08 MED ORDER — FENTANYL CITRATE (PF) 100 MCG/2ML IJ SOLN
INTRAMUSCULAR | Status: DC | PRN
  Administered 2017-10-08: 25 ug via INTRAVENOUS

## 2017-10-08 MED ORDER — DEXAMETHASONE SODIUM PHOSPHATE 4 MG/ML IJ SOLN
INTRAMUSCULAR | Status: DC | PRN
  Administered 2017-10-08: 4 mg via INTRAVENOUS

## 2017-10-08 MED ORDER — BUPIVACAINE LIPOSOME 1.3 % IJ SUSP
INTRAMUSCULAR | Status: DC | PRN
  Administered 2017-10-08: 9.5 mL
  Administered 2017-10-08: 5 mL

## 2017-10-08 MED ORDER — BUPIVACAINE HCL 0.25 % IJ SOLN
INTRAMUSCULAR | Status: DC | PRN
  Administered 2017-10-08: 4.5 mL
  Administered 2017-10-08: 5 mL

## 2017-10-08 MED ORDER — ONDANSETRON HCL 4 MG/2ML IJ SOLN
INTRAMUSCULAR | Status: DC | PRN
  Administered 2017-10-08: 4 mg via INTRAVENOUS

## 2017-10-08 MED ORDER — LACTATED RINGERS IV SOLN
INTRAVENOUS | Status: DC
  Administered 2017-10-08: 10:00:00 via INTRAVENOUS

## 2017-10-08 MED ORDER — FENTANYL CITRATE (PF) 100 MCG/2ML IJ SOLN: 25.0000 ug | INTRAMUSCULAR | Status: DC | PRN

## 2017-10-08 MED ORDER — CEFAZOLIN SODIUM-DEXTROSE 2-4 GM/100ML-% IV SOLN
2.0000 g | INTRAVENOUS | Status: AC
  Administered 2017-10-08: 2 g via INTRAVENOUS

## 2017-10-08 MED ORDER — GLYCOPYRROLATE 0.2 MG/ML IJ SOLN
INTRAMUSCULAR | Status: DC | PRN
  Administered 2017-10-08: 0.1 mg via INTRAVENOUS

## 2017-10-08 MED ORDER — LIDOCAINE HCL (CARDIAC) PF 100 MG/5ML IV SOSY
PREFILLED_SYRINGE | INTRAVENOUS | Status: DC | PRN
  Administered 2017-10-08: 30 mg via INTRATRACHEAL

## 2017-10-08 MED ORDER — POVIDONE-IODINE 7.5 % EX SOLN
Freq: Once | CUTANEOUS | Status: AC
  Administered 2017-10-08: 11:00:00 via TOPICAL

## 2017-10-08 MED ORDER — PROPOFOL 10 MG/ML IV BOLUS
INTRAVENOUS | Status: DC | PRN
  Administered 2017-10-08: 200 mg via INTRAVENOUS

## 2017-10-08 MED ORDER — MIDAZOLAM HCL 5 MG/5ML IJ SOLN
INTRAMUSCULAR | Status: DC | PRN
  Administered 2017-10-08: 2 mg via INTRAVENOUS

## 2017-10-08 SURGICAL SUPPLY — 29 items
BLADE ENDOTRAC PUSH EPF/EGR (MISCELLANEOUS) ×2 IMPLANT
BLADE TRIANGLE EPF/EGR ENDO (BLADE) ×2 IMPLANT
BNDG ESMARK 4X12 TAN STRL LF (GAUZE/BANDAGES/DRESSINGS) ×2 IMPLANT
BNDG GAUZE 4.5X4.1 6PLY STRL (MISCELLANEOUS) ×2 IMPLANT
BNDG STRETCH 4X75 STRL LF (GAUZE/BANDAGES/DRESSINGS) ×2 IMPLANT
CANISTER SUCT 1200ML W/VALVE (MISCELLANEOUS) ×2 IMPLANT
CHLORAPREP W/TINT 26ML (MISCELLANEOUS) ×2 IMPLANT
COVER LIGHT HANDLE UNIVERSAL (MISCELLANEOUS) ×4 IMPLANT
CUFF TOURN SGL QUICK 18 (TOURNIQUET CUFF) ×2 IMPLANT
GAUZE PETRO XEROFOAM 5X9 (MISCELLANEOUS) ×2 IMPLANT
GAUZE SPONGE 4X4 12PLY STRL (GAUZE/BANDAGES/DRESSINGS) ×2 IMPLANT
GLOVE BIO SURGEON STRL SZ8 (GLOVE) ×2 IMPLANT
GOWN STRL REUS W/ TWL LRG LVL3 (GOWN DISPOSABLE) ×1 IMPLANT
GOWN STRL REUS W/ TWL XL LVL3 (GOWN DISPOSABLE) ×1 IMPLANT
GOWN STRL REUS W/TWL LRG LVL3 (GOWN DISPOSABLE) ×1
GOWN STRL REUS W/TWL XL LVL3 (GOWN DISPOSABLE) ×1
IV NS 250ML (IV SOLUTION) ×1
IV NS 250ML BAXH (IV SOLUTION) ×1 IMPLANT
K-WIRE DBL END TROCAR 6X.062 (WIRE) ×2
KIT TURNOVER KIT A (KITS) ×2 IMPLANT
KWIRE DBL END TROCAR 6X.062 (WIRE) ×1 IMPLANT
NEEDLE 18GX1X1/2 (RX/OR ONLY) (NEEDLE) ×2 IMPLANT
NEEDLE HYPO 25GX1X1/2 BEV (NEEDLE) ×2 IMPLANT
NS IRRIG 500ML POUR BTL (IV SOLUTION) ×2 IMPLANT
PACK EXTREMITY ARMC (MISCELLANEOUS) ×2 IMPLANT
STOCKINETTE STRL 6IN 960660 (GAUZE/BANDAGES/DRESSINGS) ×2 IMPLANT
SUT ETHILON 3-0 (SUTURE) ×2 IMPLANT
SYR 10ML LL (SYRINGE) ×2 IMPLANT
WAND TOPAZ MICRO DEBRIDER (MISCELLANEOUS) ×2 IMPLANT

## 2017-10-08 NOTE — Anesthesia Procedure Notes (Addendum)
Procedure Name: LMA Insertion Date/Time: 10/08/2017 11:15 AM Performed by: Cameron Ali, CRNA Pre-anesthesia Checklist: Patient identified, Emergency Drugs available, Suction available, Timeout performed and Patient being monitored Patient Re-evaluated:Patient Re-evaluated prior to induction Oxygen Delivery Method: Circle system utilized Preoxygenation: Pre-oxygenation with 100% oxygen Induction Type: IV induction LMA: LMA inserted LMA Size: 4.0 Number of attempts: 1 Placement Confirmation: positive ETCO2 and breath sounds checked- equal and bilateral Tube secured with: Tape Dental Injury: Teeth and Oropharynx as per pre-operative assessment  Comments: Cold sore type lesion on right upper lip and tongue in pre op.

## 2017-10-08 NOTE — Anesthesia Preprocedure Evaluation (Signed)
Anesthesia Evaluation  Patient identified by MRN, date of birth, ID band Patient awake    Reviewed: Allergy & Precautions, NPO status , Patient's Chart, lab work & pertinent test results  Airway Mallampati: II  TM Distance: >3 FB Neck ROM: Full    Dental no notable dental hx.    Pulmonary neg pulmonary ROS,    Pulmonary exam normal breath sounds clear to auscultation       Cardiovascular negative cardio ROS Normal cardiovascular exam Rhythm:Regular Rate:Normal     Neuro/Psych negative neurological ROS  negative psych ROS   GI/Hepatic negative GI ROS, Neg liver ROS, GERD  Medicated and Controlled,  Endo/Other  negative endocrine ROS  Renal/GU negative Renal ROS  negative genitourinary   Musculoskeletal negative musculoskeletal ROS (+)   Abdominal   Peds negative pediatric ROS (+)  Hematology negative hematology ROS (+)   Anesthesia Other Findings   Reproductive/Obstetrics negative OB ROS                             Anesthesia Physical Anesthesia Plan  ASA: II  Anesthesia Plan: General   Post-op Pain Management:    Induction: Intravenous  PONV Risk Score and Plan:   Airway Management Planned: LMA  Additional Equipment:   Intra-op Plan:   Post-operative Plan: Extubation in OR  Informed Consent: I have reviewed the patients History and Physical, chart, labs and discussed the procedure including the risks, benefits and alternatives for the proposed anesthesia with the patient or authorized representative who has indicated his/her understanding and acceptance.   Dental advisory given  Plan Discussed with: CRNA  Anesthesia Plan Comments:         Anesthesia Quick Evaluation

## 2017-10-08 NOTE — H&P (Signed)
H and P has been reviewed and no changes are noted.  

## 2017-10-08 NOTE — Anesthesia Postprocedure Evaluation (Signed)
Anesthesia Post Note  Patient: Tracey Watkins  Procedure(s) Performed: PLANTAR FASCIA RELEASE - WITH TOPAZ (Left Foot)  Patient location during evaluation: PACU Anesthesia Type: General Level of consciousness: awake and alert Pain management: pain level controlled Vital Signs Assessment: post-procedure vital signs reviewed and stable Respiratory status: spontaneous breathing, nonlabored ventilation, respiratory function stable and patient connected to nasal cannula oxygen Cardiovascular status: blood pressure returned to baseline and stable Postop Assessment: no apparent nausea or vomiting Anesthetic complications: no    Ala Kratz C

## 2017-10-08 NOTE — Op Note (Signed)
Operative note   Surgeon: Dr. Albertine Patricia, DPM.    Assistant: None    Preop diagnosis: Chronic plantar fasciitis left foot    Postop diagnosis: Same    Procedure:   1.  Endoscopic partial release medial third left plantar fascial ligament   2.  Topaz fasciotomy plantar left heel        EBL: Less than 5 cc    Anesthesia:general delivered by the anesthesia team.  Preoperatively I injected 6 cc of Exparel rel with 0.25% Marcaine mixed 50-50 around the operative site.  Postoperatively I injected approximately 10 cc.    Hemostasis: Calf tourniquet 225 mils mercury pressure for 20 minutes    Specimen: None    Complications: None    Operative indications: Chronic plantar fasciitis unresponsive to conservative care    Procedure:  Patient was brought into the OR and placed on the operating table in thesupine position. After anesthesia was obtained theleft lower extremity was prepped and draped in usual sterile fashion.  Operative Report: This time tissue directed to the medial left heel.  A 22-gauge needle was used to establish the distal portion of the plantar calcaneus.  Once this establish the area was marked about 1/2 cm distal to that and 1 cm incision was made linearly at that region.  There is an bluntly probed with a hemostat until the plantar fascial ligament was encountered this time a probe from the endoscopic set was used to free soft tissue all the way across on the plantar surface of the plantar fascial ligament.  Canyon trocar was introduced in the area and pushed laterally once there is a bulge on the skin a small incision just 5 mm in length was made over that area and the camera trocar combination was pushed out.  Trocar was removed and the camera was introduced medially and the cannula.  Good visualization of the plantar fascial ligament was noted.  A triangular blade was used to initiate the incision running from medial to lateral the medial one third of the ligament.   After the incision was established I used a push blade to get up against the actual plantar fascial ligament muscle separation area and pushed it medially.  This allowed me to visualize and see the muscle belly underneath indicating a good release of the one third portion of the ligament.  This point there was copiously irrigated and the medial lateral incisions were closed with 1 simple interrupted suture of 3-0 nylon.  Now attention was directed to the plantar aspect of the left heel where I had earlier marked a grid pattern where the most intense plantar fascial pain was noted.  There approximately 20 marks that were all punctured with a 0.062 K wire and this time the Topaz wand was introduced into each 1 of those and the wand was used to ablate the ligament in those regions where most of the inflammation and chronic myofascial changes were noted.  This time a sterile compressive dressing was placed across wound consisting of Xeroform gauze 4 x 4's Kling and Kerlix.  Tourniquet was released prior to complete vascular nursing return to all digits of the left foot.  A posterior splint was placed on left foot leg in the operating room.    Patient tolerated the procedure and anesthesia well.  Was transported from the OR to the PACU with all vital signs stable and vascular status intact. To be discharged per routine protocol.  Will follow up in approximately 1 week in  the outpatient clinic.

## 2017-10-08 NOTE — Transfer of Care (Signed)
Immediate Anesthesia Transfer of Care Note  Patient: Tracey Watkins  Procedure(s) Performed: PLANTAR FASCIA RELEASE - WITH TOPAZ (Left )  Patient Location: PACU  Anesthesia Type: General  Level of Consciousness: awake, alert  and patient cooperative  Airway and Oxygen Therapy: Patient Spontanous Breathing and Patient connected to supplemental oxygen  Post-op Assessment: Post-op Vital signs reviewed, Patient's Cardiovascular Status Stable, Respiratory Function Stable, Patent Airway and No signs of Nausea or vomiting  Post-op Vital Signs: Reviewed and stable  Complications: No apparent anesthesia complications

## 2017-10-09 ENCOUNTER — Encounter: Payer: Self-pay | Admitting: Podiatry

## 2017-12-09 DIAGNOSIS — J069 Acute upper respiratory infection, unspecified: Secondary | ICD-10-CM | POA: Diagnosis not present

## 2018-06-10 ENCOUNTER — Other Ambulatory Visit: Payer: Self-pay | Admitting: Obstetrics and Gynecology

## 2018-06-10 NOTE — Telephone Encounter (Signed)
Pt needs annual exam

## 2018-07-13 ENCOUNTER — Ambulatory Visit (INDEPENDENT_AMBULATORY_CARE_PROVIDER_SITE_OTHER): Payer: BC Managed Care – PPO | Admitting: Obstetrics and Gynecology

## 2018-07-13 ENCOUNTER — Other Ambulatory Visit: Payer: Self-pay

## 2018-07-13 ENCOUNTER — Encounter: Payer: Self-pay | Admitting: Obstetrics and Gynecology

## 2018-07-13 ENCOUNTER — Other Ambulatory Visit: Payer: Self-pay | Admitting: Obstetrics and Gynecology

## 2018-07-13 VITALS — BP 120/66 | HR 76 | Ht 67.0 in | Wt 208.0 lb

## 2018-07-13 DIAGNOSIS — Z131 Encounter for screening for diabetes mellitus: Secondary | ICD-10-CM | POA: Diagnosis not present

## 2018-07-13 DIAGNOSIS — Z13220 Encounter for screening for lipoid disorders: Secondary | ICD-10-CM | POA: Diagnosis not present

## 2018-07-13 DIAGNOSIS — N951 Menopausal and female climacteric states: Secondary | ICD-10-CM

## 2018-07-13 DIAGNOSIS — Z01419 Encounter for gynecological examination (general) (routine) without abnormal findings: Secondary | ICD-10-CM | POA: Diagnosis not present

## 2018-07-13 DIAGNOSIS — Z1329 Encounter for screening for other suspected endocrine disorder: Secondary | ICD-10-CM

## 2018-07-13 DIAGNOSIS — Z1239 Encounter for other screening for malignant neoplasm of breast: Secondary | ICD-10-CM

## 2018-07-13 MED ORDER — NORETHINDRONE ACETATE 5 MG PO TABS: 5.0000 mg | ORAL_TABLET | Freq: Every day | ORAL | 11 refills | Status: DC

## 2018-07-13 NOTE — Progress Notes (Signed)
Gynecology Annual Exam  PCP: Malachy Mood, MD  Chief Complaint:  Chief Complaint  Patient presents with  . Gynecologic Exam    History of Present Illness:Patient is a 48 y.o. G3P3 presents for annual exam. The patient has no complaints today.   LMP: No LMP recorded. (Menstrual status: Oral contraceptives). Amenorrhea on norethindrone.  The patient is sexually active. She denies dyspareunia.  The patient does perform self breast exams.  There is no notable family history of breast or ovarian cancer in her family.  The patient wears seatbelts: yes.   The patient has regular exercise: not asked.    The patient denies current symptoms of depression.     Review of Systems: Review of Systems  Constitutional: Positive for diaphoresis. Negative for chills and fever.  HENT: Negative for congestion.   Respiratory: Negative for cough and shortness of breath.   Cardiovascular: Negative for chest pain and palpitations.  Gastrointestinal: Negative for abdominal pain, constipation, diarrhea, heartburn, nausea and vomiting.  Genitourinary: Negative for dysuria, frequency and urgency.  Skin: Negative for itching and rash.  Neurological: Negative for dizziness and headaches.  Endo/Heme/Allergies: Negative for polydipsia.  Psychiatric/Behavioral: Negative for depression.    Past Medical History:  Past Medical History:  Diagnosis Date  . Carpal tunnel syndrome    Left Wrist  . Cholecystitis    Requiring Lap Chole (02/28/16)  . Endometriosis   . GERD (gastroesophageal reflux disease)     Past Surgical History:  Past Surgical History:  Procedure Laterality Date  . CHOLECYSTECTOMY N/A 02/27/2016   Procedure: LAPAROSCOPIC CHOLECYSTECTOMY;  Surgeon: Jules Husbands, MD;  Location: [facility] ORS;  Service: General;  Laterality: N/A;  . COLONOSCOPY WITH PROPOFOL N/A 09/16/2016   Procedure: COLONOSCOPY WITH PROPOFOL;  Surgeon: Lucilla Lame, MD;  Location: [facility] ENDOSCOPY;  Service: Endoscopy;   Laterality: N/A;  . COLONOSCOPY WITH PROPOFOL N/A 10/07/2016   Procedure: COLONOSCOPY WITH PROPOFOL;  Surgeon: Lucilla Lame, MD;  Location: [facility] ENDOSCOPY;  Service: Endoscopy;  Laterality: N/A;  . COLONOSCOPY WITH PROPOFOL N/A 09/29/2017   Procedure: COLONOSCOPY WITH PROPOFOL;  Surgeon: Lucilla Lame, MD;  Location: [facility] ENDOSCOPY;  Service: Endoscopy;  Laterality: N/A;  . DIAGNOSTIC LAPAROSCOPY    . PLANTAR FASCIA RELEASE Left 10/08/2017   Procedure: PLANTAR FASCIA RELEASE - WITH TOPAZ;  Surgeon: Albertine Patricia, DPM;  Location: [facility];  Service: Podiatry;  Laterality: Left;  TOPAZ ENDOSCOPIC PLANTAR FASC SET LMA WITH EXPRELL  . TUBAL LIGATION      Gynecologic History:  No LMP recorded. (Menstrual status: Oral contraceptives). Last Pap: Results were: 07/10/2017 NIL and HR HPV negative  Last mammogram: 08/14/2017 Results were: BI-RAD I  Obstetric History: G3P3  Family History:  Family History  Problem Relation Age of Onset  . Breast cancer Paternal Grandmother 34  . Healthy Mother   . Healthy Father     Social History:  Social History   Socioeconomic History  . Marital status: Married    Spouse name: Not on file  . Number of children: Not on file  . Years of education: Not on file  . Highest education level: Not on file  Occupational History  . Not on file  Social Needs  . Financial resource strain: Not on file  . Food insecurity    Worry: Not on file    Inability: Not on file  . Transportation needs    Medical: Not on file    Non-medical: Not on file  Tobacco Use  .  Smoking status: Never Smoker  . Smokeless tobacco: Never Used  Substance and Sexual Activity  . Alcohol use: No  . Drug use: No  . Sexual activity: Yes    Birth control/protection: Pill  Lifestyle  . Physical activity    Days per week: Not on file    Minutes per session: Not on file  . Stress: Not on file  Relationships  . Social Herbalist on phone: Not on file    Gets  together: Not on file    Attends religious service: Not on file    Active member of club or organization: Not on file    Attends meetings of clubs or organizations: Not on file    Relationship status: Not on file  . Intimate partner violence    Fear of current or ex partner: Not on file    Emotionally abused: Not on file    Physically abused: Not on file    Forced sexual activity: Not on file  Other Topics Concern  . Not on file  Social History Narrative  . Not on file    Allergies:  Allergies  Allergen Reactions  . Skin Adhesives [Cyanoacrylate] Rash    "Dermabond"    Medications: Prior to Admission medications   Medication Sig Start Date End Date Taking? Authorizing Provider  norethindrone (AYGESTIN) 5 MG tablet TAKE 1 TABLET BY MOUTH DAILY 06/10/18  Yes Malachy Mood, MD  ranitidine (ZANTAC) 150 MG tablet Take 150 mg by mouth 2 (two) times daily.   Yes [provider]    Physical Exam Vitals: Blood pressure 120/66, pulse 76, height 5\' 7"  (1.702 m), weight 208 lb (94.3 kg).  General: NAD HEENT: normocephalic, anicteric Thyroid: no enlargement, no palpable nodules Pulmonary: No increased work of breathing, CTAB Cardiovascular: RRR, distal pulses 2+ Breast: Breast symmetrical, no tenderness, no palpable nodules or masses, no skin or nipple retraction present, no nipple discharge.  No axillary or supraclavicular lymphadenopathy. Abdomen: NABS, soft, non-tender, non-distended.  Umbilicus without lesions.  No hepatomegaly, splenomegaly or masses palpable. No evidence of hernia  Genitourinary:  External: Normal external female genitalia.  Normal urethral meatus, normal Bartholin's and Skene's glands.    Vagina: Normal vaginal mucosa, no evidence of prolapse.    Cervix: Grossly normal in appearance, no bleeding  Uterus: Non-enlarged, mobile, normal contour.  No CMT  Adnexa: ovaries non-enlarged, no adnexal masses  Rectal: deferred  Lymphatic: no evidence of  inguinal lymphadenopathy Extremities: no edema, erythema, or tenderness Neurologic: Grossly intact Psychiatric: mood appropriate, affect full  Female chaperone present for pelvic and breast  portions of the physical exam     Assessment: 48 y.o. G3P3 routine annual exam  Plan: Problem List Items Addressed This Visit    None    Visit Diagnoses    Encounter for gynecological examination without abnormal finding    -  Primary   Relevant Orders   FSH   Estradiol   CMP14+LP+TP+TSH+CBC/Plt   Breast screening       Relevant Orders   MM 3D SCREEN BREAST BILATERAL   Perimenopausal vasomotor symptoms       Relevant Orders   FSH   Estradiol   Thyroid disorder screening       Relevant Orders   CMP14+LP+TP+TSH+CBC/Plt   Screening for diabetes mellitus       Relevant Orders   CMP14+LP+TP+TSH+CBC/Plt   Lipid screening       Relevant Orders   CMP14+LP+TP+TSH+CBC/Plt      1)  Mammogram - recommend yearly screening mammogram.  Mammogram Was ordered today  2) STI screening  was notoffered and therefore not obtained  3) ASCCP guidelines and rational discussed.  Patient opts for every 3 years screening interval  4) Osteoporosis  - per USPTF routine screening DEXA at age 60  5) Routine healthcare maintenance including cholesterol, diabetes screening discussed Ordered today  6) Colonoscopy UTD 09/29/2017  7) Vasomotor symptoms - FSH/Estradiol obtained.  If consistent with menopause discontinue norethindrone (currently on for endometriosis)  8) Return in about 1 year (around 07/13/2019) for annual.    Malachy Mood, MD Mosetta Pigeon, [place] 07/13/2018, 11:03 AM

## 2018-07-13 NOTE — Patient Instructions (Signed)
[facility] [address] [place] [address]  [facility] [place]  [address] [place] [address]  Phone: [phone]  

## 2018-07-14 LAB — CMP14+LP+TP+TSH+CBC/PLT
ALT: 20 IU/L (ref 0–32)
AST: 17 IU/L (ref 0–40)
Albumin/Globulin Ratio: 1.6 (ref 1.2–2.2)
Albumin: 4.3 g/dL (ref 3.8–4.8)
Alkaline Phosphatase: 107 IU/L (ref 39–117)
BUN/Creatinine Ratio: 9 (ref 9–23)
BUN: 9 mg/dL (ref 6–24)
Bilirubin Total: 0.2 mg/dL (ref 0.0–1.2)
CO2: 21 mmol/L (ref 20–29)
Calcium: 8.9 mg/dL (ref 8.7–10.2)
Chloride: 105 mmol/L (ref 96–106)
Cholesterol, Total: 164 mg/dL (ref 100–199)
Creatinine, Ser: 1.04 mg/dL — ABNORMAL HIGH (ref 0.57–1.00)
Free Thyroxine Index: 1.5 (ref 1.2–4.9)
GFR calc Af Amer: 72 mL/min/{1.73_m2} (ref >59)
GFR calc non Af Amer: 63 mL/min/{1.73_m2} (ref >59)
Globulin, Total: 2.7 g/dL (ref 1.5–4.5)
Glucose: 86 mg/dL (ref 65–99)
HDL: 31 mg/dL — ABNORMAL LOW (ref >39)
Hematocrit: 40.1 % (ref 34.0–46.6)
Hemoglobin: 13.2 g/dL (ref 11.1–15.9)
LDL Calculated: 106 mg/dL — ABNORMAL HIGH (ref 0–99)
LDl/HDL Ratio: 3.4 ratio — ABNORMAL HIGH (ref 0.0–3.2)
MCH: 32 pg (ref 26.6–33.0)
MCHC: 32.9 g/dL (ref 31.5–35.7)
MCV: 97 fL (ref 79–97)
Platelets: 203 10*3/uL (ref 150–450)
Potassium: 4.3 mmol/L (ref 3.5–5.2)
RBC: 4.13 x10E6/uL (ref 3.77–5.28)
RDW: 12.8 % (ref 11.7–15.4)
Sodium: 141 mmol/L (ref 134–144)
T3 Uptake Ratio: 27 % (ref 24–39)
T4, Total: 5.5 ug/dL (ref 4.5–12.0)
TSH: 2.59 u[IU]/mL (ref 0.450–4.500)
Total Protein: 7 g/dL (ref 6.0–8.5)
Triglycerides: 136 mg/dL (ref 0–149)
VLDL Cholesterol Cal: 27 mg/dL (ref 5–40)
WBC: 4.9 10*3/uL (ref 3.4–10.8)

## 2018-07-14 LAB — FOLLICLE STIMULATING HORMONE: FSH: 97.8 m[IU]/mL

## 2018-07-14 LAB — ESTRADIOL: Estradiol: <5 pg/mL

## 2018-09-09 ENCOUNTER — Ambulatory Visit
Admission: RE | Admit: 2018-09-09 | Discharge: 2018-09-09 | Disposition: A | Discharge status: Home / Self Care | Payer: Self-pay | Source: Ambulatory Visit | Attending: Obstetrics and Gynecology | Admitting: Obstetrics and Gynecology

## 2018-09-09 DIAGNOSIS — Z1231 Encounter for screening mammogram for malignant neoplasm of breast: Secondary | ICD-10-CM | POA: Insufficient documentation

## 2018-09-09 DIAGNOSIS — Z1239 Encounter for other screening for malignant neoplasm of breast: Secondary | ICD-10-CM

## 2018-09-23 IMAGING — MG MM DIGITAL SCREENING BILAT W/ TOMO W/ CAD
8 series · 8 of 24 positions shown · non-contrast
Comparison: Previous exam(s).

CLINICAL DATA: Screening.

EXAM:
DIGITAL SCREENING BILATERAL MAMMOGRAM WITH TOMO AND CAD

[L MLO synth-2D]
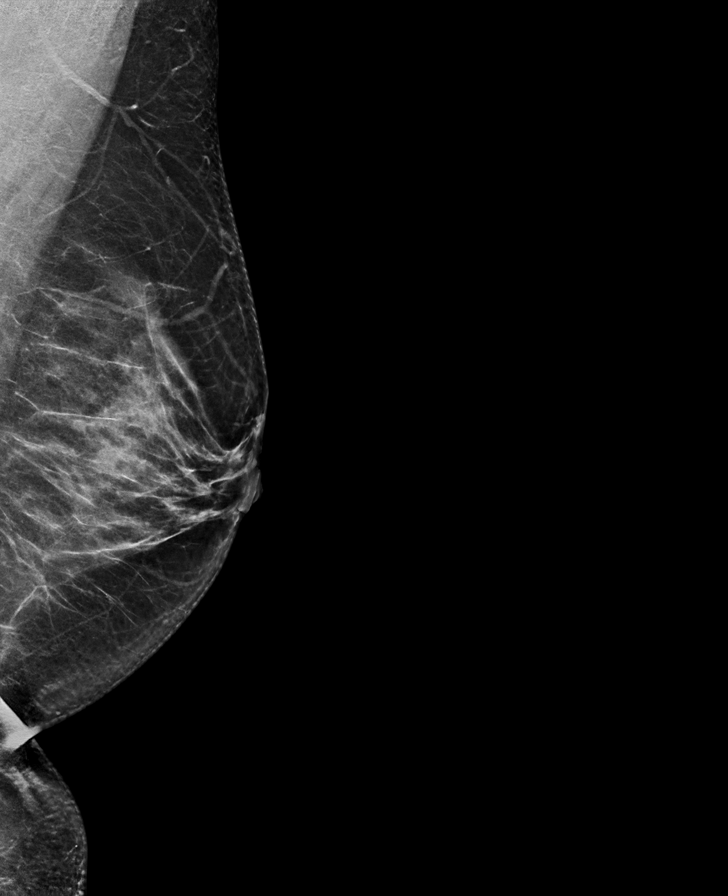

[L CC synth-2D]
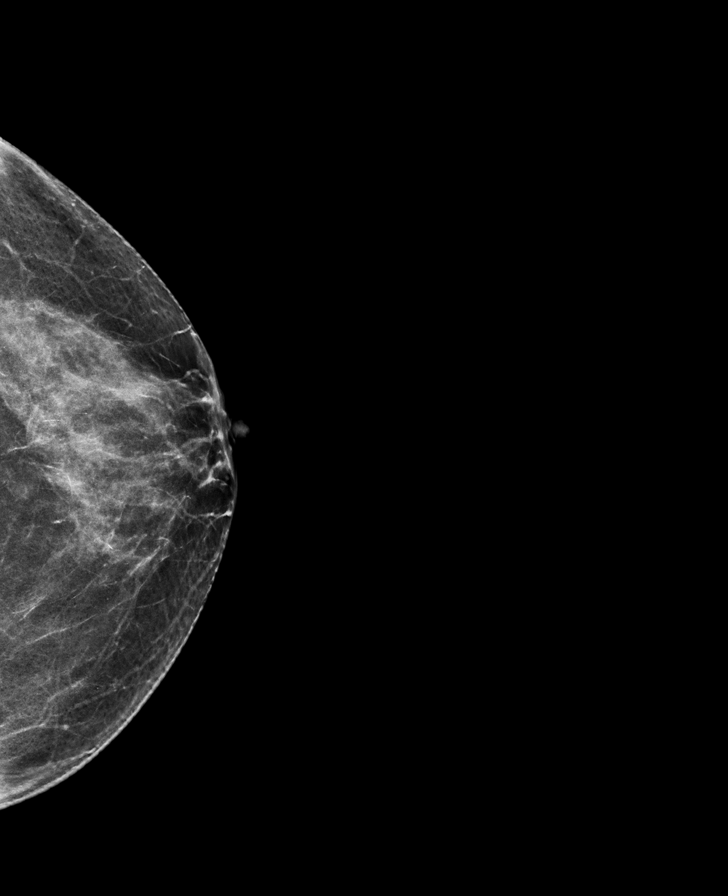

[R CC synth-2D]
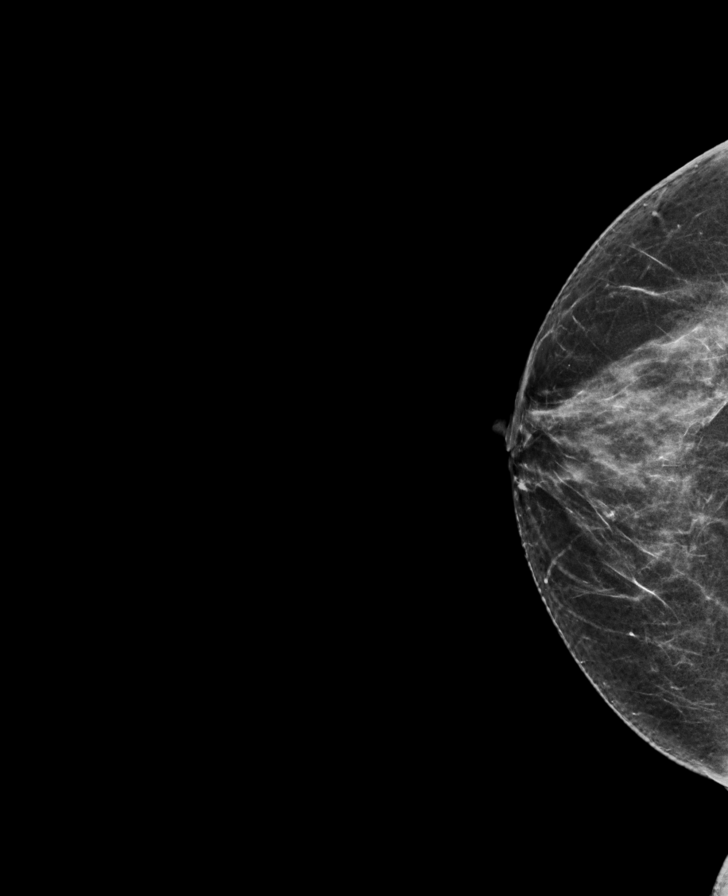

[R MLO synth-2D]
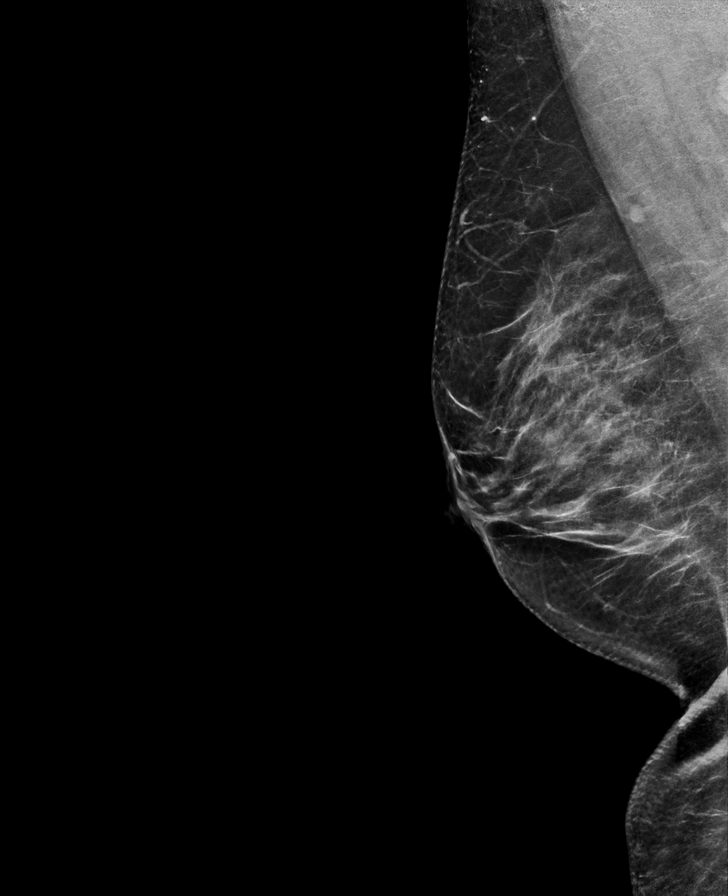

[L MLO tomo · tomo slice 35/70.0]
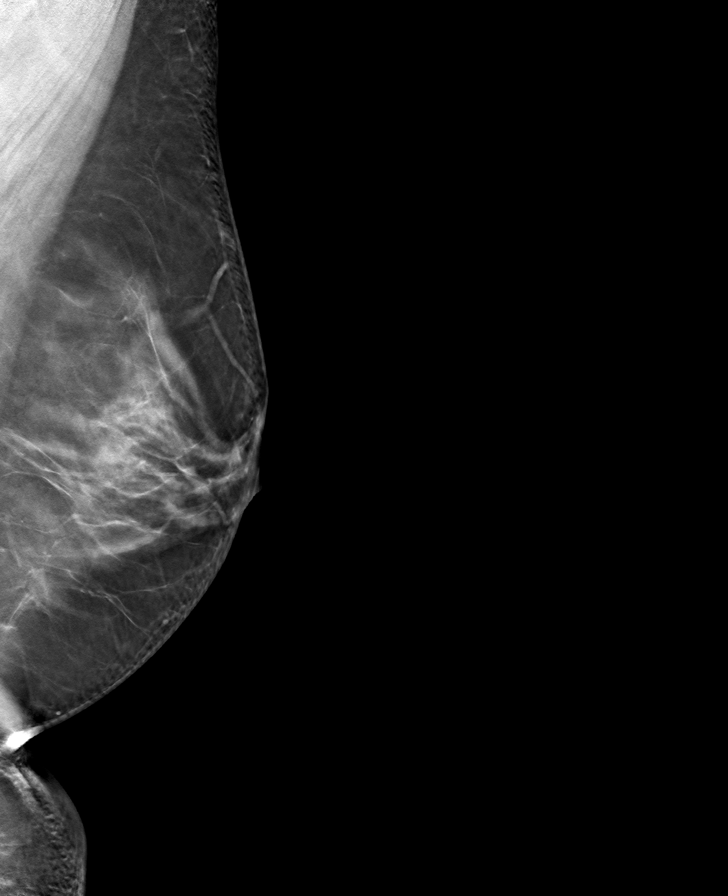

[R CC tomo · tomo slice 32/63.0]
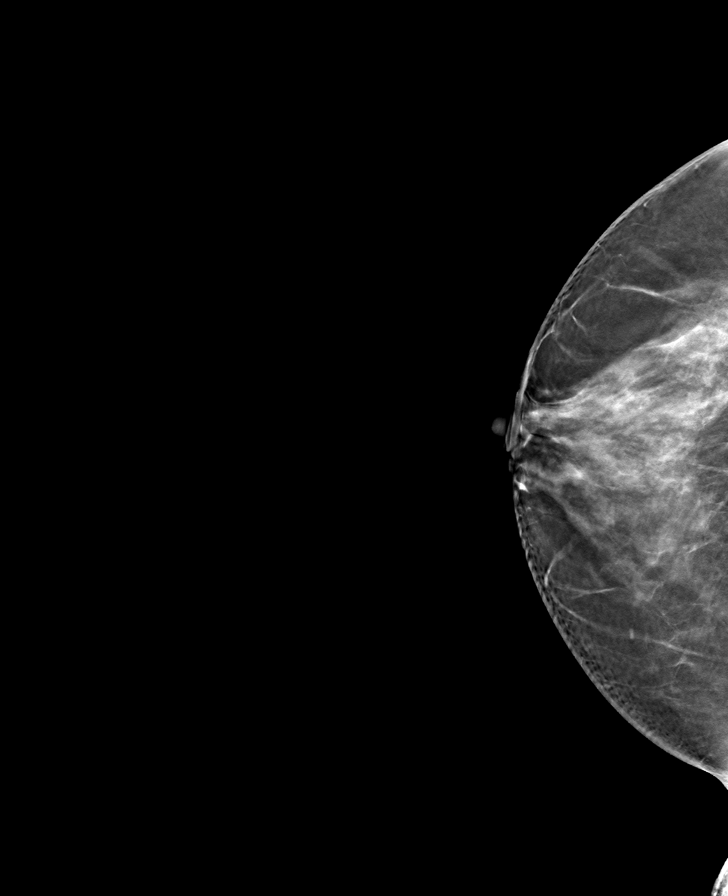

[R MLO tomo · tomo slice 36/71.0]
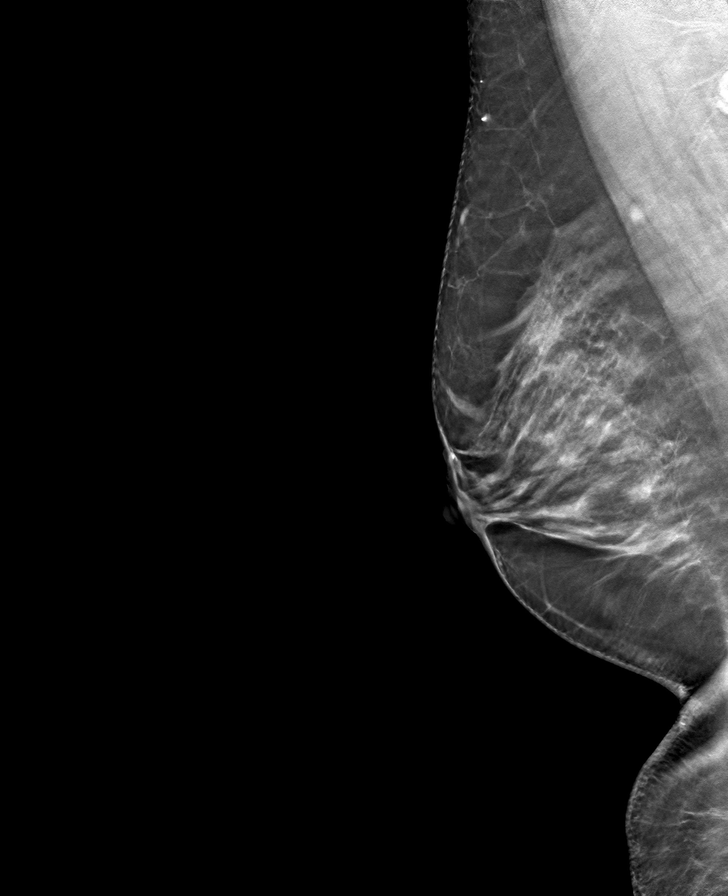

[L CC tomo · tomo slice 33/66.0]
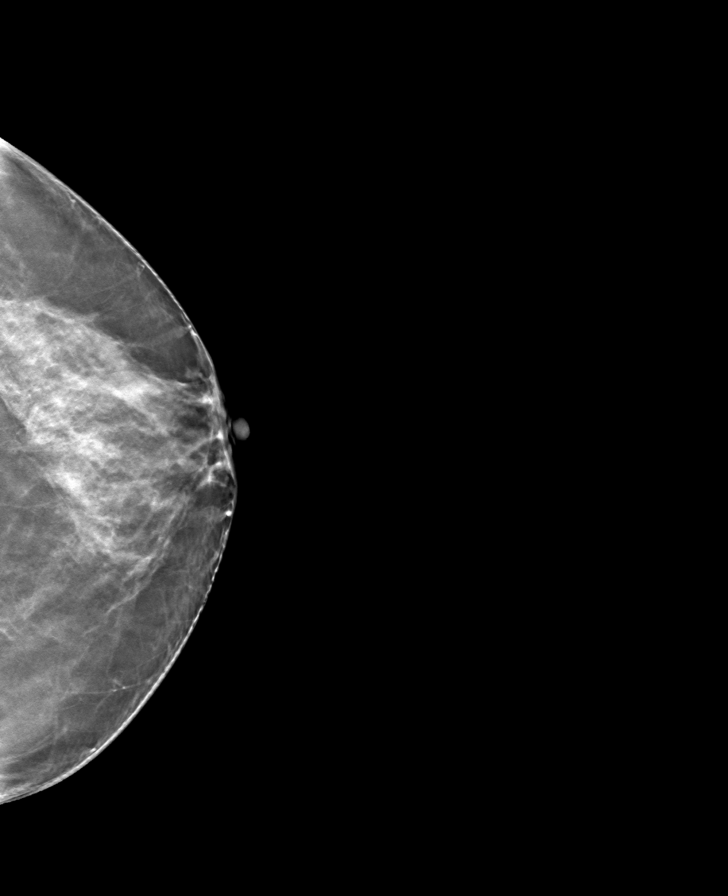

[8 of 24 positions shown; findings below may reference images not displayed]

ACR Breast Density Category c: The breast tissue is heterogeneously
dense, which may obscure small masses.
FINDINGS: There are no findings suspicious for malignancy. Images were
processed with CAD.
IMPRESSION: No mammographic evidence of malignancy. A result letter of this
screening mammogram will be mailed directly to the patient.

RECOMMENDATION:
Screening mammogram in one year. (Code:FT-U-LHB)

BI-RADS CATEGORY  1: Negative.

## 2019-06-15 DIAGNOSIS — L309 Dermatitis, unspecified: Secondary | ICD-10-CM | POA: Diagnosis not present

## 2019-06-16 ENCOUNTER — Other Ambulatory Visit: Payer: Self-pay | Admitting: Obstetrics and Gynecology

## 2019-06-16 NOTE — Telephone Encounter (Signed)
Pt has an appointment 07/25/19 for annual

## 2019-07-15 ENCOUNTER — Other Ambulatory Visit: Payer: Self-pay | Admitting: Obstetrics and Gynecology

## 2019-07-25 ENCOUNTER — Ambulatory Visit: Payer: BC Managed Care – PPO | Admitting: Obstetrics and Gynecology

## 2019-07-25 DIAGNOSIS — L219 Seborrheic dermatitis, unspecified: Secondary | ICD-10-CM | POA: Diagnosis not present

## 2019-08-03 ENCOUNTER — Ambulatory Visit (INDEPENDENT_AMBULATORY_CARE_PROVIDER_SITE_OTHER): Payer: BC Managed Care – PPO | Admitting: Obstetrics and Gynecology

## 2019-08-03 ENCOUNTER — Other Ambulatory Visit: Payer: Self-pay

## 2019-08-03 ENCOUNTER — Encounter: Payer: Self-pay | Admitting: Obstetrics and Gynecology

## 2019-08-03 VITALS — BP 120/80 | Ht 67.0 in | Wt 215.0 lb

## 2019-08-03 DIAGNOSIS — Z1239 Encounter for other screening for malignant neoplasm of breast: Secondary | ICD-10-CM

## 2019-08-03 DIAGNOSIS — Z01419 Encounter for gynecological examination (general) (routine) without abnormal findings: Secondary | ICD-10-CM

## 2019-08-03 MED ORDER — NORETHINDRONE ACETATE 5 MG PO TABS: ORAL_TABLET | ORAL | 3 refills | Status: DC

## 2019-08-03 NOTE — Progress Notes (Signed)
Gynecology Annual Exam  PCP: Malachy Mood, MD  Chief Complaint:  Chief Complaint  Patient presents with  . Gynecologic Exam    History of Present Illness:Patient is a 49 y.o. G3P3 presents for annual exam. The patient has no complaints today.   LMP: No LMP recorded. (Menstrual status: Oral contraceptives). Absent on on norethindrone for cycle supression  The patient is sexually active. She denies dyspareunia.  The patient does perform self breast exams.  There is notable family history of breast or ovarian cancer in her family.  The patient wears seatbelts: yes.   The patient has regular exercise: not asked.    The patient denies current symptoms of depression.     Review of Systems: Review of Systems  Constitutional: Negative for chills and fever.  HENT: Negative for congestion.   Respiratory: Negative for cough and shortness of breath.   Cardiovascular: Negative for chest pain and palpitations.  Gastrointestinal: Negative for abdominal pain, constipation, diarrhea, heartburn, nausea and vomiting.  Genitourinary: Negative for dysuria, frequency and urgency.  Skin: Negative for itching and rash.  Neurological: Negative for dizziness and headaches.  Endo/Heme/Allergies: Negative for polydipsia.  Psychiatric/Behavioral: Negative for depression.    Past Medical History:  Patient Active Problem List   Diagnosis Date Noted  . Personal history of colonic polyps   . Adenoma determined by colorectal biopsy 11/18/2016    Tubovillous adenoma repeat colonoscopy in 1 year (2019)   . Colon cancer screening   . Benign neoplasm of cecum   . Screen for colon cancer   . S/P laparoscopic cholecystectomy 02/29/2016  . Allergic reaction 02/29/2016  . Carpal tunnel syndrome of left wrist 10/03/2014    Past Surgical History:  Past Surgical History:  Procedure Laterality Date  . CHOLECYSTECTOMY N/A 02/27/2016   Procedure: LAPAROSCOPIC CHOLECYSTECTOMY;  Surgeon: Jules Husbands,  MD;  Location: [facility] ORS;  Service: General;  Laterality: N/A;  . COLONOSCOPY WITH PROPOFOL N/A 09/16/2016   Procedure: COLONOSCOPY WITH PROPOFOL;  Surgeon: Lucilla Lame, MD;  Location: [facility] ENDOSCOPY;  Service: Endoscopy;  Laterality: N/A;  . COLONOSCOPY WITH PROPOFOL N/A 10/07/2016   Procedure: COLONOSCOPY WITH PROPOFOL;  Surgeon: Lucilla Lame, MD;  Location: [facility] ENDOSCOPY;  Service: Endoscopy;  Laterality: N/A;  . COLONOSCOPY WITH PROPOFOL N/A 09/29/2017   Procedure: COLONOSCOPY WITH PROPOFOL;  Surgeon: Lucilla Lame, MD;  Location: [facility] ENDOSCOPY;  Service: Endoscopy;  Laterality: N/A;  . DIAGNOSTIC LAPAROSCOPY    . PLANTAR FASCIA RELEASE Left 10/08/2017   Procedure: PLANTAR FASCIA RELEASE - WITH TOPAZ;  Surgeon: Albertine Patricia, DPM;  Location: [place];  Service: Podiatry;  Laterality: Left;  [facility] ENDOSCOPIC PLANTAR FASC SET LMA WITH EXPRELL  . TUBAL LIGATION      Gynecologic History:  No LMP recorded. (Menstrual status: Oral contraceptives). Last Pap: Results were: 07/10/2017 NIL and HR HPV negative  Last mammogram: 09/09/2018 Results were: BI-RAD I  Obstetric History: G3P3  Family History:  Family History  Problem Relation Age of Onset  . Breast cancer Paternal Grandmother 78  . Healthy Mother   . ALS Mother   . Healthy Father     Social History:  Social History   Socioeconomic History  . Marital status: Married    Spouse name: Not on file  . Number of children: Not on file  . Years of education: Not on file  . Highest education level: Not on file  Occupational History  . Not on file  Tobacco Use  . Smoking status:  Never Smoker  . Smokeless tobacco: Never Used  Vaping Use  . Vaping Use: Never used  Substance and Sexual Activity  . Alcohol use: No  . Drug use: No  . Sexual activity: Yes    Birth control/protection: Pill  Other Topics Concern  . Not on file  Social History Narrative  . Not on file   Social Determinants of Health   Financial  Resource Strain:   . Difficulty of Paying Living Expenses:   Food Insecurity:   . Worried About Charity fundraiser in the Last Year:   . Arboriculturist in the Last Year:   Transportation Needs:   . Film/video editor (Medical):   Marland Kitchen Lack of Transportation (Non-Medical):   Physical Activity:   . Days of Exercise per Week:   . Minutes of Exercise per Session:   Stress:   . Feeling of Stress :   Social Connections:   . Frequency of Communication with Friends and Family:   . Frequency of Social Gatherings with Friends and Family:   . Attends Religious Services:   . Active Member of Clubs or Organizations:   . Attends Archivist Meetings:   Marland Kitchen Marital Status:   Intimate Partner Violence:   . Fear of Current or Ex-Partner:   . Emotionally Abused:   Marland Kitchen Physically Abused:   . Sexually Abused:     Allergies:  Allergies  Allergen Reactions  . Skin Adhesives [Cyanoacrylate] Rash    "Dermabond"    Medications: Prior to Admission medications   Medication Sig Start Date End Date Taking? Authorizing Provider  norethindrone (AYGESTIN) 5 MG tablet TAKE 1 TABLET(5 MG) BY MOUTH DAILY 07/15/19  Yes Malachy Mood, MD  ranitidine (ZANTAC) 150 MG tablet Take 150 mg by mouth 2 (two) times daily.   Yes [provider]    Physical Exam Vitals: Blood pressure 120/80, height 5\' 7"  (1.702 m), weight 215 lb (97.5 kg).  General: NAD HEENT: normocephalic, anicteric Thyroid: no enlargement, no palpable nodules Pulmonary: No increased work of breathing, CTAB Cardiovascular: RRR, distal pulses 2+ Breast: Breast symmetrical, no tenderness, no palpable nodules or masses, no skin or nipple retraction present, no nipple discharge.  No axillary or supraclavicular lymphadenopathy. Abdomen: NABS, soft, non-tender, non-distended.  Umbilicus without lesions.  No hepatomegaly, splenomegaly or masses palpable. No evidence of hernia  Genitourinary:  External: Normal external female  genitalia.  Normal urethral meatus, normal Bartholin's and Skene's glands.    Vagina: Normal vaginal mucosa, no evidence of prolapse.    Cervix: Grossly normal in appearance, no bleeding  Uterus: Non-enlarged, mobile, normal contour.  No CMT  Adnexa: ovaries non-enlarged, no adnexal masses  Rectal: deferred  Lymphatic: no evidence of inguinal lymphadenopathy Extremities: no edema, erythema, or tenderness Neurologic: Grossly intact Psychiatric: mood appropriate, affect full  Female chaperone present for pelvic and breast  portions of the physical exam     Assessment: 49 y.o. G3P3 routine annual exam  Plan: Problem List Items Addressed This Visit    None    Visit Diagnoses    Encounter for gynecological examination without abnormal finding    -  Primary   Breast screening       Relevant Orders   MM 3D SCREEN BREAST BILATERAL      1) Mammogram - recommend yearly screening mammogram.  Mammogram Was ordered today  2) STI screening  was notoffered and therefore not obtained  3) ASCCP guidelines and rational discussed.  Patient opts for  every 3 years screening interval  4) Osteoporosis  - per USPTF routine screening DEXA at age 62  5) Routine healthcare maintenance including cholesterol, diabetes screening discussed managed by PCP  6) Colonoscopy Colonosocopy 09/29/2016  7) Return in about 1 year (around 08/02/2020) for annual.    Malachy Mood, MD Mosetta Pigeon, [facility] 08/03/2019, 8:24 AM

## 2019-08-03 NOTE — Patient Instructions (Signed)
[facility] [address] [place] [address]  [facility] [place]  [address] [place] [address]  Phone: [phone]  

## 2019-10-18 ENCOUNTER — Other Ambulatory Visit: Payer: Self-pay

## 2019-10-18 MED ORDER — NORETHINDRONE ACETATE 5 MG PO TABS: ORAL_TABLET | ORAL | 2 refills | Status: DC

## 2020-07-06 ENCOUNTER — Other Ambulatory Visit: Payer: Self-pay | Admitting: Obstetrics and Gynecology

## 2020-08-03 ENCOUNTER — Other Ambulatory Visit: Payer: Self-pay

## 2020-08-03 ENCOUNTER — Encounter: Payer: Self-pay | Admitting: Obstetrics and Gynecology

## 2020-08-03 ENCOUNTER — Ambulatory Visit (INDEPENDENT_AMBULATORY_CARE_PROVIDER_SITE_OTHER): Payer: BC Managed Care – PPO | Admitting: Obstetrics and Gynecology

## 2020-08-03 ENCOUNTER — Other Ambulatory Visit (HOSPITAL_COMMUNITY)
Admission: RE | Admit: 2020-08-03 | Discharge: 2020-08-03 | Disposition: A | Discharge status: Home / Self Care | Payer: Self-pay | Source: Ambulatory Visit | Attending: Obstetrics and Gynecology | Admitting: Obstetrics and Gynecology

## 2020-08-03 VITALS — BP 118/78 | HR 70 | Ht 67.0 in | Wt 215.0 lb

## 2020-08-03 DIAGNOSIS — Z1239 Encounter for other screening for malignant neoplasm of breast: Secondary | ICD-10-CM | POA: Diagnosis not present

## 2020-08-03 DIAGNOSIS — Z1329 Encounter for screening for other suspected endocrine disorder: Secondary | ICD-10-CM

## 2020-08-03 DIAGNOSIS — Z131 Encounter for screening for diabetes mellitus: Secondary | ICD-10-CM

## 2020-08-03 DIAGNOSIS — Z6833 Body mass index (BMI) 33.0-33.9, adult: Secondary | ICD-10-CM

## 2020-08-03 DIAGNOSIS — R5383 Other fatigue: Secondary | ICD-10-CM | POA: Diagnosis not present

## 2020-08-03 DIAGNOSIS — Z124 Encounter for screening for malignant neoplasm of cervix: Secondary | ICD-10-CM | POA: Diagnosis not present

## 2020-08-03 DIAGNOSIS — Z1321 Encounter for screening for nutritional disorder: Secondary | ICD-10-CM

## 2020-08-03 DIAGNOSIS — E669 Obesity, unspecified: Secondary | ICD-10-CM | POA: Diagnosis not present

## 2020-08-03 DIAGNOSIS — Z13220 Encounter for screening for lipoid disorders: Secondary | ICD-10-CM

## 2020-08-03 DIAGNOSIS — Z780 Asymptomatic menopausal state: Secondary | ICD-10-CM

## 2020-08-03 DIAGNOSIS — Z01419 Encounter for gynecological examination (general) (routine) without abnormal findings: Secondary | ICD-10-CM | POA: Diagnosis not present

## 2020-08-03 MED ORDER — NORETHINDRONE ACETATE 5 MG PO TABS: ORAL_TABLET | ORAL | 3 refills | Status: DC

## 2020-08-03 NOTE — Patient Instructions (Signed)
[facility] [address] [place] [address]  [facility] [place]  [address] [place] [address]  Phone: [phone]  

## 2020-08-03 NOTE — Progress Notes (Signed)
Gynecology Annual Exam  PCP: Malachy Mood, MD  Chief Complaint:  Chief Complaint  Patient presents with   Gynecologic Exam    Annual - no concerns. RM 5    History of Present Illness:Patient is a 50 y.o. G3P3 presents for annual exam. The patient has no complaints today.   LMP: No LMP recorded. (Menstrual status: Oral contraceptives). No bleeding on norethindrone  The patient is sexually active. She denies dyspareunia.  The patient does perform self breast exams.  There is no notable family history of breast or ovarian cancer in her family.  The patient wears seatbelts: yes.   The patient has regular exercise: not asked.    The patient denies current symptoms of depression.     Review of Systems: Review of Systems  Constitutional:  Negative for chills and fever.  HENT:  Negative for congestion.   Respiratory:  Negative for cough and shortness of breath.   Cardiovascular:  Negative for chest pain and palpitations.  Gastrointestinal:  Negative for abdominal pain, constipation, diarrhea, heartburn, nausea and vomiting.  Genitourinary:  Negative for dysuria, frequency and urgency.  Skin:  Negative for itching and rash.  Neurological:  Negative for dizziness and headaches.  Endo/Heme/Allergies:  Negative for polydipsia.  Psychiatric/Behavioral:  Negative for depression.    Past Medical History:  Patient Active Problem List   Diagnosis Date Noted   Personal history of colonic polyps    Adenoma determined by colorectal biopsy 11/18/2016    Tubovillous adenoma repeat colonoscopy in 1 year (2019)     Colon cancer screening    Benign neoplasm of cecum    Screen for colon cancer    S/P laparoscopic cholecystectomy 02/29/2016   Allergic reaction 02/29/2016   Carpal tunnel syndrome of left wrist 10/03/2014    Past Surgical History:  Past Surgical History:  Procedure Laterality Date   CHOLECYSTECTOMY N/A 02/27/2016   Procedure: LAPAROSCOPIC CHOLECYSTECTOMY;   Surgeon: Jules Husbands, MD;  Location: [facility] ORS;  Service: General;  Laterality: N/A;   COLONOSCOPY WITH PROPOFOL N/A 09/16/2016   Procedure: COLONOSCOPY WITH PROPOFOL;  Surgeon: Lucilla Lame, MD;  Location: [facility] ENDOSCOPY;  Service: Endoscopy;  Laterality: N/A;   COLONOSCOPY WITH PROPOFOL N/A 10/07/2016   Procedure: COLONOSCOPY WITH PROPOFOL;  Surgeon: Lucilla Lame, MD;  Location: [facility] ENDOSCOPY;  Service: Endoscopy;  Laterality: N/A;   COLONOSCOPY WITH PROPOFOL N/A 09/29/2017   Procedure: COLONOSCOPY WITH PROPOFOL;  Surgeon: Lucilla Lame, MD;  Location: [facility] ENDOSCOPY;  Service: Endoscopy;  Laterality: N/A;   DIAGNOSTIC LAPAROSCOPY     PLANTAR FASCIA RELEASE Left 10/08/2017   Procedure: PLANTAR FASCIA RELEASE - WITH TOPAZ;  Surgeon: Albertine Patricia, DPM;  Location: [place];  Service: Podiatry;  Laterality: Left;  [facility] ENDOSCOPIC PLANTAR FASC SET LMA WITH EXPRELL   TUBAL LIGATION      Gynecologic History:  No LMP recorded. (Menstrual status: Oral contraceptives). Last Pap: Results were: 07/10/2017 NILM HPV negative Last mammogram: 09/09/2018 Results were: BI-RAD I  Obstetric History: G3P3  Family History:  Family History  Problem Relation Age of Onset   ALS Mother    Dementia Mother    Healthy Father    Breast cancer Paternal Grandmother 32    Social History:  Social History   Socioeconomic History   Marital status: Married    Spouse name: Not on file   Number of children: Not on file   Years of education: Not on file   Highest education level: Not on file  Occupational History   Not on file  Tobacco Use   Smoking status: Never   Smokeless tobacco: Never  Vaping Use   Vaping Use: Never used  Substance and Sexual Activity   Alcohol use: No   Drug use: No   Sexual activity: Yes    Birth control/protection: Pill  Other Topics Concern   Not on file  Social History Narrative   Not on file   Social Determinants of Health   Financial Resource Strain: Not on  file  Food Insecurity: Not on file  Transportation Needs: Not on file  Physical Activity: Not on file  Stress: Not on file  Social Connections: Not on file  Intimate Partner Violence: Not on file    Allergies:  Allergies  Allergen Reactions   Skin Adhesives [Cyanoacrylate] Rash    "Dermabond"    Medications: Prior to Admission medications   Medication Sig Start Date End Date Taking? Authorizing Provider  norethindrone (AYGESTIN) 5 MG tablet TAKE 1 TABLET DAILY 07/10/20  Yes Malachy Mood, MD  ranitidine (ZANTAC) 150 MG tablet Take 150 mg by mouth 2 (two) times daily.   Yes [provider]    Physical Exam Vitals: Blood pressure 118/78, pulse 70, height '5\' 7"'$  (1.702 m), weight 215 lb (97.5 kg). Body mass index is 33.67 kg/m.   General: NAD HEENT: normocephalic, anicteric Thyroid: no enlargement, no palpable nodules Pulmonary: No increased work of breathing, CTAB Cardiovascular: RRR, distal pulses 2+ Breast: Breast symmetrical, no tenderness, no palpable nodules or masses, no skin or nipple retraction present, no nipple discharge.  No axillary or supraclavicular lymphadenopathy. Abdomen: NABS, soft, non-tender, non-distended.  Umbilicus without lesions.  No hepatomegaly, splenomegaly or masses palpable. No evidence of hernia  Genitourinary:  External: Normal external female genitalia.  Normal urethral meatus, normal Bartholin's and Skene's glands.    Vagina: Normal vaginal mucosa, no evidence of prolapse.    Cervix: Grossly normal in appearance, no bleeding  Uterus: Non-enlarged, mobile, normal contour.  No CMT  Adnexa: ovaries non-enlarged, no adnexal masses  Rectal: deferred  Lymphatic: no evidence of inguinal lymphadenopathy Extremities: no edema, erythema, or tenderness Neurologic: Grossly intact Psychiatric: mood appropriate, affect full  Female chaperone present for pelvic and breast  portions of the physical exam     Assessment: 50 y.o. G3P3  routine annual exam  Plan: Problem List Items Addressed This Visit   None Visit Diagnoses     Screening for malignant neoplasm of cervix    -  Primary   Relevant Orders   Cytology - PAP   Breast screening       Relevant Orders   MM 3D SCREEN BREAST BILATERAL   Encounter for gynecological examination without abnormal finding       Relevant Orders   CBC With Differential   Comprehensive metabolic panel   Lipid panel   TSH   Hemoglobin A1c   Vitamin D (25 hydroxy)   Screening for diabetes mellitus       Relevant Orders   Hemoglobin A1c   Thyroid disorder screening       Relevant Orders   TSH   Lipid screening       Relevant Orders   Lipid panel   Class 1 obesity without serious comorbidity with body mass index (BMI) of 33.0 to 33.9 in adult, unspecified obesity type       Relevant Orders   Lipid panel   TSH   Hemoglobin A1c   Fatigue, unspecified type  Relevant Orders   CBC With Differential   Comprehensive metabolic panel   TSH   Vitamin D (25 hydroxy)   Encounter for vitamin deficiency screening       Relevant Orders   Vitamin D (25 hydroxy)   Postmenopausal estrogen deficiency       Relevant Orders   Vitamin D (25 hydroxy)       1) Mammogram - recommend yearly screening mammogram.  Mammogram Was ordered today  2) STI screening  was notoffered and therefore not obtained  3) ASCCP guidelines and rational discussed.  Patient opts for every 3 years screening interval  4) Osteoporosis  - per USPTF routine screening DEXA at age 67  5) Routine healthcare maintenance including cholesterol, diabetes screening discussed Ordered today - will check FSH/estradiol to see if able to discontinue norethindrone  6) Colonoscopy - UTD 2019 [place] repeat 2024  7) Return in about 1 year (around 08/03/2021) for annual.    Malachy Mood, MD Mosetta Pigeon, [facility] 08/03/2020, 9:27 AM

## 2020-08-04 LAB — COMPREHENSIVE METABOLIC PANEL
ALT: 14 IU/L (ref 0–32)
AST: 12 IU/L (ref 0–40)
Albumin/Globulin Ratio: 1.8 (ref 1.2–2.2)
Albumin: 4.2 g/dL (ref 3.8–4.9)
Alkaline Phosphatase: 87 IU/L (ref 44–121)
BUN/Creatinine Ratio: 9 (ref 9–23)
BUN: 9 mg/dL (ref 6–24)
Bilirubin Total: 0.2 mg/dL (ref 0.0–1.2)
CO2: 20 mmol/L (ref 20–29)
Calcium: 8.6 mg/dL — ABNORMAL LOW (ref 8.7–10.2)
Chloride: 111 mmol/L — ABNORMAL HIGH (ref 96–106)
Creatinine, Ser: 1 mg/dL (ref 0.57–1.00)
Globulin, Total: 2.3 g/dL (ref 1.5–4.5)
Glucose: 97 mg/dL (ref 65–99)
Potassium: 4.7 mmol/L (ref 3.5–5.2)
Sodium: 143 mmol/L (ref 134–144)
Total Protein: 6.5 g/dL (ref 6.0–8.5)
eGFR: 68 mL/min/{1.73_m2} (ref >59)

## 2020-08-04 LAB — CBC WITH DIFFERENTIAL
Basophils Absolute: 0.1 10*3/uL (ref 0.0–0.2)
Basos: 1 %
EOS (ABSOLUTE): 0.5 10*3/uL — ABNORMAL HIGH (ref 0.0–0.4)
Eos: 12 %
Hematocrit: 37.7 % (ref 34.0–46.6)
Hemoglobin: 12.5 g/dL (ref 11.1–15.9)
Immature Grans (Abs): 0 10*3/uL (ref 0.0–0.1)
Immature Granulocytes: 0 %
Lymphocytes Absolute: 1.1 10*3/uL (ref 0.7–3.1)
Lymphs: 26 %
MCH: 33.1 pg — ABNORMAL HIGH (ref 26.6–33.0)
MCHC: 33.2 g/dL (ref 31.5–35.7)
MCV: 100 fL — ABNORMAL HIGH (ref 79–97)
Monocytes Absolute: 0.5 10*3/uL (ref 0.1–0.9)
Monocytes: 10 %
Neutrophils Absolute: 2.2 10*3/uL (ref 1.4–7.0)
Neutrophils: 51 %
RBC: 3.78 x10E6/uL (ref 3.77–5.28)
RDW: 12.5 % (ref 11.7–15.4)
WBC: 4.3 10*3/uL (ref 3.4–10.8)

## 2020-08-04 LAB — HEMOGLOBIN A1C
Est. average glucose Bld gHb Est-mCnc: 105 mg/dL
Hgb A1c MFr Bld: 5.3 % (ref 4.8–5.6)

## 2020-08-04 LAB — LIPID PANEL
Chol/HDL Ratio: 6.5 ratio — ABNORMAL HIGH (ref 0.0–4.4)
Cholesterol, Total: 149 mg/dL (ref 100–199)
HDL: 23 mg/dL — ABNORMAL LOW (ref >39)
LDL Chol Calc (NIH): 104 mg/dL — ABNORMAL HIGH (ref 0–99)
Triglycerides: 120 mg/dL (ref 0–149)
VLDL Cholesterol Cal: 22 mg/dL (ref 5–40)

## 2020-08-04 LAB — VITAMIN D 25 HYDROXY (VIT D DEFICIENCY, FRACTURES): Vit D, 25-Hydroxy: 11.9 ng/mL — ABNORMAL LOW (ref 30.0–100.0)

## 2020-08-04 LAB — TSH: TSH: 2.36 u[IU]/mL (ref 0.450–4.500)

## 2020-08-04 LAB — ESTRADIOL: Estradiol: <5 pg/mL

## 2020-08-04 LAB — FOLLICLE STIMULATING HORMONE: FSH: 29.5 m[IU]/mL

## 2020-08-08 ENCOUNTER — Other Ambulatory Visit: Payer: Self-pay | Admitting: Family Medicine

## 2020-08-10 LAB — CYTOLOGY - PAP
Comment: NEGATIVE
Diagnosis: UNDETERMINED — AB
High risk HPV: NEGATIVE

## 2020-08-20 ENCOUNTER — Other Ambulatory Visit: Payer: Self-pay | Admitting: Obstetrics and Gynecology

## 2020-08-20 MED ORDER — VITAMIN D (ERGOCALCIFEROL) 1.25 MG (50000 UNIT) PO CAPS: 50000.0000 [IU] | ORAL_CAPSULE | ORAL | 0 refills | Status: AC

## 2020-08-31 ENCOUNTER — Ambulatory Visit
Admission: RE | Admit: 2020-08-31 | Discharge: 2020-08-31 | Disposition: A | Discharge status: Home / Self Care | Payer: BC Managed Care – PPO | Source: Ambulatory Visit | Attending: Obstetrics and Gynecology | Admitting: Obstetrics and Gynecology

## 2020-08-31 ENCOUNTER — Other Ambulatory Visit: Payer: Self-pay

## 2020-08-31 DIAGNOSIS — Z1231 Encounter for screening mammogram for malignant neoplasm of breast: Secondary | ICD-10-CM | POA: Insufficient documentation

## 2020-08-31 DIAGNOSIS — Z1239 Encounter for other screening for malignant neoplasm of breast: Secondary | ICD-10-CM

## 2020-09-19 ENCOUNTER — Other Ambulatory Visit: Payer: Self-pay | Admitting: Obstetrics and Gynecology

## 2021-09-10 ENCOUNTER — Encounter: Payer: Self-pay | Admitting: Obstetrics & Gynecology

## 2021-09-10 ENCOUNTER — Ambulatory Visit (INDEPENDENT_AMBULATORY_CARE_PROVIDER_SITE_OTHER): Payer: BC Managed Care – PPO | Admitting: Obstetrics & Gynecology

## 2021-09-10 VITALS — BP 112/70 | HR 79 | Resp 16 | Ht 67.0 in | Wt 216.4 lb

## 2021-09-10 DIAGNOSIS — N939 Abnormal uterine and vaginal bleeding, unspecified: Secondary | ICD-10-CM | POA: Diagnosis not present

## 2021-09-10 MED ORDER — NORETHINDRONE ACETATE 5 MG PO TABS: ORAL_TABLET | ORAL | 3 refills | Status: DC

## 2021-09-10 MED ORDER — NORETHINDRONE ACETATE 5 MG PO TABS: 5.0000 mg | ORAL_TABLET | Freq: Every day | ORAL | 3 refills | Status: DC

## 2021-09-10 MED ORDER — NORETHINDRONE ACETATE 5 MG PO TABS: 5.0000 mg | ORAL_TABLET | Freq: Every day | ORAL | 4 refills | Status: DC

## 2021-10-16 ENCOUNTER — Other Ambulatory Visit: Payer: Self-pay | Admitting: Obstetrics & Gynecology

## 2021-10-16 DIAGNOSIS — Z1231 Encounter for screening mammogram for malignant neoplasm of breast: Secondary | ICD-10-CM

## 2021-10-17 ENCOUNTER — Ambulatory Visit
Admission: RE | Admit: 2021-10-17 | Discharge: 2021-10-17 | Disposition: A | Discharge status: Home / Self Care | Payer: BC Managed Care – PPO | Source: Ambulatory Visit | Attending: Obstetrics & Gynecology | Admitting: Obstetrics & Gynecology

## 2021-10-17 DIAGNOSIS — Z1231 Encounter for screening mammogram for malignant neoplasm of breast: Secondary | ICD-10-CM | POA: Diagnosis not present

## 2021-10-17 NOTE — Progress Notes (Signed)
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       Start Date: 09/10/21 End Date: --  Written Date: 09/10/21 Expiration Date: 09/10/22   Rosario Adie, MD  10/17/2021 2:30 AM

## 2021-10-19 DIAGNOSIS — Z719 Counseling, unspecified: Secondary | ICD-10-CM | POA: Diagnosis not present

## 2021-10-19 DIAGNOSIS — J069 Acute upper respiratory infection, unspecified: Secondary | ICD-10-CM | POA: Diagnosis not present

## 2022-09-11 NOTE — Progress Notes (Signed)
PCP: Vena Austria, MD   Chief Complaint  Patient presents with   Gynecologic Exam    No concerns    HPI:      [name] is a 52 y.o. G3P3 whose LMP was No LMP recorded. (Menstrual status: Oral contraceptives)., presents today for her annual examination.  Her menses are absent on aygestin. Started for endometriosis. No BTB/dysmen. Hasn't tried going off POPs. Does have some vasomotor sx.   Sex activity: single partner, contraception - POPs.  She does not have vaginal dryness.  Last Pap: 08/03/20 Results were: ASCUS with NEGATIVE high risk HPV .  Last mammogram: 10/17/21 Results were: normal--routine follow-up in 12 months There is a FH of breast cancer in her PGM, genetic testing not indicated. There is no FH of ovarian cancer. The patient does do self-breast exams.  Colonoscopy: 2019 with Dr. Servando Snare; hx of polyps;  Repeat due after 5 years. Plans to call to get it scheduled.   Tobacco use: The patient denies current or previous tobacco use. Alcohol use: none No drug use Exercise: moderately active  She does get adequate calcium but not Vitamin D in her diet. Hx of Vit D deficiency.  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 11/18/2016   Colon cancer screening    Benign neoplasm of cecum    Screen for colon cancer    S/P laparoscopic cholecystectomy 02/29/2016   Allergic reaction 02/29/2016   Carpal tunnel syndrome of left wrist 10/03/2014    Past Surgical History:  Procedure Laterality Date   CHOLECYSTECTOMY N/A 02/27/2016   Procedure: LAPAROSCOPIC CHOLECYSTECTOMY;  Surgeon: Leafy Ro, MD;  Location: [facility] ORS;  Service: General;  Laterality: N/A;   COLONOSCOPY WITH PROPOFOL N/A 09/16/2016   Procedure: COLONOSCOPY WITH PROPOFOL;  Surgeon: Midge Minium, MD;  Location: [facility] ENDOSCOPY;  Service: Endoscopy;  Laterality: N/A;   COLONOSCOPY  WITH PROPOFOL N/A 10/07/2016   Procedure: COLONOSCOPY WITH PROPOFOL;  Surgeon: Midge Minium, MD;  Location: [facility] ENDOSCOPY;  Service: Endoscopy;  Laterality: N/A;   COLONOSCOPY WITH PROPOFOL N/A 09/29/2017   Procedure: COLONOSCOPY WITH PROPOFOL;  Surgeon: Midge Minium, MD;  Location: [facility] ENDOSCOPY;  Service: Endoscopy;  Laterality: N/A;   DIAGNOSTIC LAPAROSCOPY     PLANTAR FASCIA RELEASE Left 10/08/2017   Procedure: PLANTAR FASCIA RELEASE - WITH TOPAZ;  Surgeon: Recardo Evangelist, DPM;  Location: [facility];  Service: Podiatry;  Laterality: Left;  [facility]AZ ENDOSCOPIC PLANTAR FASC SET LMA WITH EXPRELL   TUBAL LIGATION      Family History  Problem Relation Age of Onset   ALS Mother    Dementia Mother    Healthy Father    Breast cancer Paternal Grandmother 63    Social History   Socioeconomic History   Marital status: Married    Spouse name: Not on file   Number of children: Not on file   Years of education: Not on file   Highest education level: Not on file  Occupational History   Not on file  Tobacco Use   Smoking status: Never   Smokeless tobacco: Never  Vaping Use   Vaping status: Never Used  Substance and Sexual Activity   Alcohol use: No   Drug use: No   Sexual activity: Yes    Birth control/protection: Pill  Other Topics Concern   Not on file  Social History  Narrative   Not on file   Social Determinants of Health   Financial Resource Strain: Not on file  Food Insecurity: Not on file  Transportation Needs: Not on file  Physical Activity: Not on file  Stress: Not on file  Social Connections: Not on file  Intimate Partner Violence: Not on file     Current Outpatient Medications:    norethindrone (AYGESTIN) 5 MG tablet, Take 1 tablet (5 mg total) by mouth daily., Disp: 90 tablet, Rfl: 3   ranitidine (ZANTAC) 150 MG tablet, Take 150 mg by mouth 2 (two) times daily., Disp: , Rfl:    albuterol (VENTOLIN HFA) 108 (90 Base) MCG/ACT inhaler, Inhale into the  lungs., Disp: , Rfl:      ROS:  Review of Systems  Constitutional:  Negative for fatigue, fever and unexpected weight change.  Respiratory:  Negative for cough, shortness of breath and wheezing.   Cardiovascular:  Negative for chest pain, palpitations and leg swelling.  Gastrointestinal:  Negative for blood in stool, constipation, diarrhea, nausea and vomiting.  Endocrine: Negative for cold intolerance, heat intolerance and polyuria.  Genitourinary:  Negative for dyspareunia, dysuria, flank pain, frequency, genital sores, hematuria, menstrual problem, pelvic pain, urgency, vaginal bleeding, vaginal discharge and vaginal pain.  Musculoskeletal:  Negative for back pain, joint swelling and myalgias.  Skin:  Negative for rash.  Neurological:  Negative for dizziness, syncope, light-headedness, numbness and headaches.  Hematological:  Negative for adenopathy.  Psychiatric/Behavioral:  Negative for agitation, confusion, sleep disturbance and suicidal ideas. The patient is not nervous/anxious.    BREAST: No symptoms    Objective: BP 126/74   Ht 5\' 7"  (1.702 m)   Wt 219 lb (99.3 kg)   BMI 34.30 kg/m    Physical Exam Constitutional:      Appearance: She is well-developed.  Genitourinary:     Vulva normal.     Right Labia: No rash, tenderness or lesions.    Left Labia: No tenderness, lesions or rash.    No vaginal discharge, erythema or tenderness.      Right Adnexa: not tender and no mass present.    Left Adnexa: not tender and no mass present.    No cervical friability or polyp.     Uterus is not enlarged or tender.  Breasts:    Right: No mass, nipple discharge, skin change or tenderness.     Left: No mass, nipple discharge, skin change or tenderness.  Neck:     Thyroid: No thyromegaly.  Cardiovascular:     Rate and Rhythm: Normal rate and regular rhythm.     Heart sounds: Normal heart sounds. No murmur heard. Pulmonary:     Effort: Pulmonary effort is normal.     Breath  sounds: Normal breath sounds.  Abdominal:     Palpations: Abdomen is soft.     Tenderness: There is no abdominal tenderness. There is no guarding or rebound.  Musculoskeletal:        General: Normal range of motion.     Cervical back: Normal range of motion.  Lymphadenopathy:     Cervical: No cervical adenopathy.  Neurological:     General: No focal deficit present.     Mental Status: She is alert and oriented to person, place, and time.     Cranial Nerves: No cranial nerve deficit.  Skin:    General: Skin is warm and dry.  Psychiatric:        Mood and Affect: Mood normal.  Behavior: Behavior normal.        Thought Content: Thought content normal.        Judgment: Judgment normal.  Vitals reviewed.    Assessment/Plan:  Encounter for annual routine gynecological examination  Cervical cancer screening - Plan: Cytology - PAP  Screening for HPV (human papillomavirus) - Plan: Cytology - PAP  ASCUS of cervix with negative high risk HPV - Plan: Cytology - PAP; repeat pap today. Will f/u if abn.   Encounter for surveillance of contraceptive pills--will stop POPs for now due to age. Pt to f/u if bleeding/pain resumes for POP restart.   Encounter for screening mammogram for malignant neoplasm of breast - Plan: MM 3D SCREENING MAMMOGRAM BILATERAL BREAST; pt to schedule mammo  Screening for colon cancer--pt will call to schedule. Will send ref if needed.   LABS NEXT YR         GYN counsel breast self exam, mammography screening, menopause, adequate intake of calcium and vitamin D, diet and exercise    F/U  Return in about 1 year (around 09/12/2023).  Tyashia Morrisette B. Jaquez Farrington, PA-C 09/12/2022 11:44 AM

## 2022-09-12 ENCOUNTER — Ambulatory Visit (INDEPENDENT_AMBULATORY_CARE_PROVIDER_SITE_OTHER): Payer: BC Managed Care – PPO | Admitting: Obstetrics and Gynecology

## 2022-09-12 ENCOUNTER — Encounter: Payer: Self-pay | Admitting: Obstetrics and Gynecology

## 2022-09-12 ENCOUNTER — Other Ambulatory Visit (HOSPITAL_COMMUNITY)
Admission: RE | Admit: 2022-09-12 | Discharge: 2022-09-12 | Disposition: A | Discharge status: Home / Self Care | Payer: BC Managed Care – PPO | Source: Ambulatory Visit | Attending: Obstetrics and Gynecology | Admitting: Obstetrics and Gynecology

## 2022-09-12 VITALS — BP 126/74 | Ht 67.0 in | Wt 219.0 lb

## 2022-09-12 DIAGNOSIS — R87610 Atypical squamous cells of undetermined significance on cytologic smear of cervix (ASC-US): Secondary | ICD-10-CM | POA: Diagnosis not present

## 2022-09-12 DIAGNOSIS — Z124 Encounter for screening for malignant neoplasm of cervix: Secondary | ICD-10-CM | POA: Diagnosis not present

## 2022-09-12 DIAGNOSIS — Z01419 Encounter for gynecological examination (general) (routine) without abnormal findings: Secondary | ICD-10-CM

## 2022-09-12 DIAGNOSIS — Z1151 Encounter for screening for human papillomavirus (HPV): Secondary | ICD-10-CM | POA: Insufficient documentation

## 2022-09-12 DIAGNOSIS — Z1231 Encounter for screening mammogram for malignant neoplasm of breast: Secondary | ICD-10-CM

## 2022-09-12 DIAGNOSIS — Z1211 Encounter for screening for malignant neoplasm of colon: Secondary | ICD-10-CM

## 2022-09-12 DIAGNOSIS — Z3041 Encounter for surveillance of contraceptive pills: Secondary | ICD-10-CM

## 2022-09-12 NOTE — Patient Instructions (Signed)
I value your feedback and you entrusting us with your care. If you get a [facility] patient survey, I would appreciate you taking the time to let us know about your experience today. Thank you!  [facility] ([place])--[phone]  

## 2022-09-16 ENCOUNTER — Telehealth: Payer: Self-pay

## 2022-09-16 ENCOUNTER — Telehealth: Payer: Self-pay | Admitting: Gastroenterology

## 2022-09-16 ENCOUNTER — Other Ambulatory Visit: Payer: Self-pay

## 2022-09-16 DIAGNOSIS — Z86010 Personal history of colonic polyps: Secondary | ICD-10-CM

## 2022-09-16 MED ORDER — NA SULFATE-K SULFATE-MG SULF 17.5-3.13-1.6 GM/177ML PO SOLN
1.0000 | Freq: Once | ORAL | 0 refills | Status: AC
Start: 2022-09-16 — End: 2022-09-16

## 2022-09-16 NOTE — Telephone Encounter (Signed)
Pt received letter to schedule 5 year colonoscopy requesting call back

## 2022-09-16 NOTE — Telephone Encounter (Signed)
Gastroenterology Pre-Procedure Review  Request Date: 11/04/22 Requesting Physician: Dr. Servando Snare  PATIENT REVIEW QUESTIONS: The patient responded to the following health history questions as indicated:    1. Are you having any GI issues? no 2. Do you have a personal history of Polyps? yes (LAST COLONOSCOPY 09/29/17 WITH dR [facility]) 3. Do you have a family history of Colon Cancer or Polyps? yes (MOTHER COLON POLYPS) 4. Diabetes Mellitus? no 5. Joint replacements in the past 12 months?no 6. Major health problems in the past 3 months?no 7. Any artificial heart valves, MVP, or defibrillator?no    MEDICATIONS & ALLERGIES:    Patient reports the following regarding taking any anticoagulation/antiplatelet therapy:   Plavix, Coumadin, Eliquis, Xarelto, Lovenox, Pradaxa, Brilinta, or Effient? no Aspirin? no  Patient confirms/reports the following medications:  Current Outpatient Medications  Medication Sig Dispense Refill   albuterol (VENTOLIN HFA) 108 (90 Base) MCG/ACT inhaler Inhale into the lungs.     norethindrone (AYGESTIN) 5 MG tablet Take 1 tablet (5 mg total) by mouth daily. 90 tablet 3   ranitidine (ZANTAC) 150 MG tablet Take 150 mg by mouth 2 (two) times daily.     No current facility-administered medications for this visit.    Patient confirms/reports the following allergies:  Allergies  Allergen Reactions   Skin Adhesives [Cyanoacrylate] Rash    "Dermabond"    No orders of the defined types were placed in this encounter.   AUTHORIZATION INFORMATION Primary Insurance: 1D#: Group #:  Secondary Insurance: 1D#: Group #:  SCHEDULE INFORMATION: Date: 11/04/22 Time: Location: [facility]

## 2022-09-18 LAB — CYTOLOGY - PAP
Comment: NEGATIVE
Diagnosis: UNDETERMINED — AB
High risk HPV: NEGATIVE

## 2022-09-24 ENCOUNTER — Telehealth: Payer: Self-pay

## 2022-09-24 NOTE — Telephone Encounter (Signed)
Pt called triage returning [place] call. Pt aware of pap results, and to repeat in one year.

## 2022-09-29 DIAGNOSIS — R059 Cough, unspecified: Secondary | ICD-10-CM | POA: Diagnosis not present

## 2022-09-29 DIAGNOSIS — J069 Acute upper respiratory infection, unspecified: Secondary | ICD-10-CM | POA: Diagnosis not present

## 2022-10-20 ENCOUNTER — Ambulatory Visit
Admission: RE | Admit: 2022-10-20 | Discharge: 2022-10-20 | Disposition: A | Discharge status: Home / Self Care | Payer: BC Managed Care – PPO | Source: Ambulatory Visit | Attending: Obstetrics and Gynecology | Admitting: Obstetrics and Gynecology

## 2022-10-20 DIAGNOSIS — Z1231 Encounter for screening mammogram for malignant neoplasm of breast: Secondary | ICD-10-CM | POA: Diagnosis not present

## 2022-10-28 ENCOUNTER — Encounter: Payer: Self-pay | Admitting: Gastroenterology

## 2022-11-04 ENCOUNTER — Ambulatory Visit: Payer: BC Managed Care – PPO | Admitting: Anesthesiology

## 2022-11-04 ENCOUNTER — Encounter
Admission: RE | Disposition: A | Discharge status: Home / Self Care | Payer: Self-pay | Source: Home / Self Care | Attending: Gastroenterology

## 2022-11-04 ENCOUNTER — Other Ambulatory Visit: Payer: Self-pay

## 2022-11-04 ENCOUNTER — Ambulatory Visit
Admission: RE | Admit: 2022-11-04 | Discharge: 2022-11-04 | Disposition: A | Discharge status: Home / Self Care | Payer: BC Managed Care – PPO | Attending: Gastroenterology | Admitting: Gastroenterology

## 2022-11-04 ENCOUNTER — Encounter: Payer: Self-pay | Admitting: Gastroenterology

## 2022-11-04 DIAGNOSIS — D123 Benign neoplasm of transverse colon: Secondary | ICD-10-CM | POA: Insufficient documentation

## 2022-11-04 DIAGNOSIS — Z9049 Acquired absence of other specified parts of digestive tract: Secondary | ICD-10-CM | POA: Diagnosis not present

## 2022-11-04 DIAGNOSIS — K635 Polyp of colon: Secondary | ICD-10-CM

## 2022-11-04 DIAGNOSIS — K5730 Diverticulosis of large intestine without perforation or abscess without bleeding: Secondary | ICD-10-CM | POA: Insufficient documentation

## 2022-11-04 DIAGNOSIS — Z1211 Encounter for screening for malignant neoplasm of colon: Secondary | ICD-10-CM | POA: Insufficient documentation

## 2022-11-04 DIAGNOSIS — Z860100 Personal history of colon polyps, unspecified: Secondary | ICD-10-CM | POA: Diagnosis not present

## 2022-11-04 DIAGNOSIS — K219 Gastro-esophageal reflux disease without esophagitis: Secondary | ICD-10-CM | POA: Insufficient documentation

## 2022-11-04 DIAGNOSIS — D122 Benign neoplasm of ascending colon: Secondary | ICD-10-CM | POA: Insufficient documentation

## 2022-11-04 HISTORY — PX: COLONOSCOPY WITH PROPOFOL: SHX5780

## 2022-11-04 HISTORY — PX: POLYPECTOMY: SHX5525

## 2022-11-04 SURGERY — COLONOSCOPY WITH PROPOFOL
Anesthesia: General

## 2022-11-04 MED ORDER — DEXMEDETOMIDINE HCL IN NACL 200 MCG/50ML IV SOLN
INTRAVENOUS | Status: DC | PRN
Start: 2022-11-04 — End: 2022-11-04
  Administered 2022-11-04: 12 ug via INTRAVENOUS

## 2022-11-04 MED ORDER — SODIUM CHLORIDE 0.9 % IV SOLN: INTRAVENOUS | Status: DC

## 2022-11-04 MED ORDER — PROPOFOL 500 MG/50ML IV EMUL
INTRAVENOUS | Status: DC | PRN
  Administered 2022-11-04: 145 ug/kg/min via INTRAVENOUS

## 2022-11-04 NOTE — Transfer of Care (Signed)
Immediate Anesthesia Transfer of Care Note  Patient: Tracey Watkins  Procedure(s) Performed: COLONOSCOPY WITH PROPOFOL POLYPECTOMY  Patient Location: PACU  Anesthesia Type:General  Level of Consciousness: awake and alert   Airway & Oxygen Therapy: Patient Spontanous Breathing and Patient connected to nasal cannula oxygen  Post-op Assessment: Report given to RN and Post -op Vital signs reviewed and stable  Post vital signs: Reviewed and stable  Last Vitals:  Vitals Value Taken Time  BP    Temp    Pulse    Resp    SpO2      Last Pain:  Vitals:   11/04/22 0849  TempSrc: Temporal  PainSc: 0-No pain         Complications: No notable events documented.

## 2022-11-04 NOTE — Op Note (Signed)
[facility] Gastroenterology Patient Name: Tracey Watkins Procedure Date: 11/04/2022 9:03 AM MRN: [id number] Account #: [id number] Date of Birth: 09-24-1970 Admit Type: Outpatient Age: 52 Room: [facility] ENDO ROOM 4 Gender: Female Note Status: Finalized Instrument Name: Prentice Docker [id number] Procedure:             Colonoscopy Indications:           High risk colon cancer surveillance: Personal history                         of colonic polyps Providers:             Midge Minium MD, MD Referring MD:          Ilona Sorrel. Copland (Referring MD) Medicines:             Propofol per Anesthesia Complications:         No immediate complications. Procedure:             Pre-Anesthesia Assessment:                        - Prior to the procedure, a History and Physical was                         performed, and patient medications and allergies were                         reviewed. The patient's tolerance of previous                         anesthesia was also reviewed. The risks and benefits                         of the procedure and the sedation options and risks                         were discussed with the patient. All questions were                         answered, and informed consent was obtained. Prior                         Anticoagulants: The patient has taken no anticoagulant                         or antiplatelet agents. ASA Grade Assessment: II - A                         patient with mild systemic disease. After reviewing                         the risks and benefits, the patient was deemed in                         satisfactory condition to undergo the procedure.                        After obtaining informed consent, the colonoscope was  passed under direct vision. Throughout the procedure,                         the patient's blood pressure, pulse, and oxygen                         saturations were monitored continuously. The                          Colonoscope was introduced through the anus and                         advanced to the the cecum, identified by appendiceal                         orifice and ileocecal valve. The colonoscopy was                         performed without difficulty. The patient tolerated                         the procedure well. The quality of the bowel                         preparation was poor. Findings:      The perianal and digital rectal examinations were normal.      Extensive amounts of semi-liquid stool was found in the entire colon,       precluding visualization.      Two sessile polyps were found in the ascending colon. The polyps were 5       to 9 mm in size. These polyps were removed with a cold snare. Resection       and retrieval were complete.      A 3 mm polyp was found in the transverse colon. The polyp was sessile.       The polyp was removed with a cold snare. Resection and retrieval were       complete.      Multiple small-mouthed diverticula were found in the sigmoid colon. Impression:            - Preparation of the colon was poor.                        - Stool in the entire examined colon.                        - Two 5 to 9 mm polyps in the ascending colon, removed                         with a cold snare. Resected and retrieved.                        - One 3 mm polyp in the transverse colon, removed with                         a cold snare. Resected and retrieved.                        - Diverticulosis in  the sigmoid colon. Recommendation:        - Discharge patient to home.                        - Resume previous diet.                        - Continue present medications.                        - Await pathology results.                        - Repeat colonoscopy in 1 year for surveillance due to                         poor prep with 2 day prep. Procedure Code(s):     --- Professional ---                        [phone], Colonoscopy, flexible; with  removal of                         tumor(s), polyp(s), or other lesion(s) by snare                         technique Diagnosis Code(s):     --- Professional ---                        Z86.010, Personal history of colonic polyps                        D12.3, Benign neoplasm of transverse colon (hepatic                         flexure or splenic flexure) CPT copyright 2022 American Medical Association. All rights reserved. The codes documented in this report are preliminary and upon coder review may  be revised to meet current compliance requirements. Midge Minium MD, MD 11/04/2022 9:38:46 AM This report has been signed electronically. Number of Addenda: 0 Note Initiated On: 11/04/2022 9:03 AM Scope Withdrawal Time: 0 hours 11 minutes 10 seconds  Total Procedure Duration: 0 hours 20 minutes 17 seconds  Estimated Blood Loss:  Estimated blood loss: none.      [facility]

## 2022-11-04 NOTE — Anesthesia Postprocedure Evaluation (Signed)
Anesthesia Post Note  Patient: RUSSIE PINHO  Procedure(s) Performed: COLONOSCOPY WITH PROPOFOL POLYPECTOMY  Patient location during evaluation: Endoscopy Anesthesia Type: General Level of consciousness: awake and alert Pain management: pain level controlled Vital Signs Assessment: post-procedure vital signs reviewed and stable Respiratory status: spontaneous breathing, nonlabored ventilation, respiratory function stable and patient connected to nasal cannula oxygen Cardiovascular status: blood pressure returned to baseline and stable Postop Assessment: no apparent nausea or vomiting Anesthetic complications: no   No notable events documented.   Last Vitals:  Vitals:   11/04/22 0949 11/04/22 1005  BP: (!) 83/50 110/78  Pulse: 72 66  Resp: 13 14  Temp:    SpO2: 94% 97%    Last Pain:  Vitals:   11/04/22 0949  TempSrc:   PainSc: 0-No pain                 Corinda Gubler

## 2022-11-04 NOTE — Anesthesia Preprocedure Evaluation (Signed)
Anesthesia Evaluation  Patient identified by MRN, date of birth, ID band Patient awake    Reviewed: Allergy & Precautions, NPO status , Patient's Chart, lab work & pertinent test results  History of Anesthesia Complications Negative for: history of anesthetic complications  Airway Mallampati: II  TM Distance: >3 FB Neck ROM: Full    Dental no notable dental hx. (+) Teeth Intact   Pulmonary neg pulmonary ROS, neg sleep apnea, neg COPD, Patient abstained from smoking.Not current smoker   Pulmonary exam normal breath sounds clear to auscultation       Cardiovascular Exercise Tolerance: Good METS(-) hypertension(-) CAD and (-) Past MI negative cardio ROS (-) dysrhythmias  Rhythm:Regular Rate:Normal - Systolic murmurs    Neuro/Psych negative neurological ROS  negative psych ROS   GI/Hepatic ,GERD  Medicated,,(+)     (-) substance abuse    Endo/Other  neg diabetes    Renal/GU negative Renal ROS     Musculoskeletal   Abdominal   Peds  Hematology   Anesthesia Other Findings Past Medical History: No date: Carpal tunnel syndrome     Comment:  Left Wrist No date: Cholecystitis     Comment:  Requiring Lap Chole (02/28/16) No date: Endometriosis No date: GERD (gastroesophageal reflux disease)  Reproductive/Obstetrics                             Anesthesia Physical Anesthesia Plan  ASA: 2  Anesthesia Plan: General   Post-op Pain Management: Minimal or no pain anticipated   Induction: Intravenous  PONV Risk Score and Plan: 3 and Propofol infusion, TIVA and Ondansetron  Airway Management Planned: Nasal Cannula  Additional Equipment: None  Intra-op Plan:   Post-operative Plan:   Informed Consent: I have reviewed the patients History and Physical, chart, labs and discussed the procedure including the risks, benefits and alternatives for the proposed anesthesia with the patient or  authorized representative who has indicated his/her understanding and acceptance.     Dental advisory given  Plan Discussed with: CRNA and Surgeon  Anesthesia Plan Comments: (Discussed risks of anesthesia with patient, including possibility of difficulty with spontaneous ventilation under anesthesia necessitating airway intervention, PONV, and rare risks such as cardiac or respiratory or neurological events, and allergic reactions. Discussed the role of CRNA in patient's perioperative care. Patient understands.)       Anesthesia Quick Evaluation

## 2022-11-04 NOTE — H&P (Signed)
Midge Minium, MD [facility] [address] [place], [place] [address] Phone:[phone] Fax : [phone]  Primary Care Physician:  Trenda Moots Primary Gastroenterologist:  Dr. Servando Snare  Pre-Procedure History & Physical: HPI:  Tracey Watkins is a 52 y.o. female is here for an colonoscopy.   Past Medical History:  Diagnosis Date   Carpal tunnel syndrome    Left Wrist   Cholecystitis    Requiring Lap Chole (02/28/16)   Endometriosis    GERD (gastroesophageal reflux disease)     Past Surgical History:  Procedure Laterality Date   CHOLECYSTECTOMY N/A 02/27/2016   Procedure: LAPAROSCOPIC CHOLECYSTECTOMY;  Surgeon: Leafy Ro, MD;  Location: [facility] ORS;  Service: General;  Laterality: N/A;   COLONOSCOPY WITH PROPOFOL N/A 09/16/2016   Procedure: COLONOSCOPY WITH PROPOFOL;  Surgeon: Midge Minium, MD;  Location: [facility] ENDOSCOPY;  Service: Endoscopy;  Laterality: N/A;   COLONOSCOPY WITH PROPOFOL N/A 10/07/2016   Procedure: COLONOSCOPY WITH PROPOFOL;  Surgeon: Midge Minium, MD;  Location: [facility] ENDOSCOPY;  Service: Endoscopy;  Laterality: N/A;   COLONOSCOPY WITH PROPOFOL N/A 09/29/2017   Procedure: COLONOSCOPY WITH PROPOFOL;  Surgeon: Midge Minium, MD;  Location: [facility] ENDOSCOPY;  Service: Endoscopy;  Laterality: N/A;   DIAGNOSTIC LAPAROSCOPY     PLANTAR FASCIA RELEASE Left 10/08/2017   Procedure: PLANTAR FASCIA RELEASE - WITH TOPAZ;  Surgeon: Recardo Evangelist, DPM;  Location: [facility];  Service: Podiatry;  Laterality: Left;  [facility] ENDOSCOPIC PLANTAR FASC SET LMA WITH EXPRELL   TUBAL LIGATION      Prior to Admission medications   Medication Sig Start Date End Date Taking? Authorizing Provider  ranitidine (ZANTAC) 150 MG tablet Take 150 mg by mouth 2 (two) times daily.   Yes [provider]  albuterol (VENTOLIN HFA) 108 (90 Base) MCG/ACT inhaler Inhale into the lungs. 03/13/21 03/13/22  [provider]  norethindrone (AYGESTIN) 5 MG tablet Take 1 tablet (5  mg total) by mouth daily. Patient not taking: Reported on 11/04/2022 09/10/21   Linzie Collin, MD    Allergies as of 09/16/2022 - Review Complete 09/16/2022  Allergen Reaction Noted   Skin adhesives [cyanoacrylate] Rash 02/29/2016    Family History  Problem Relation Age of Onset   ALS Mother    Dementia Mother    Healthy Father    Breast cancer Paternal Grandmother 63    Social History   Socioeconomic History   Marital status: Married    Spouse name: Not on file   Number of children: Not on file   Years of education: Not on file   Highest education level: Not on file  Occupational History   Not on file  Tobacco Use   Smoking status: Never   Smokeless tobacco: Never  Vaping Use   Vaping status: Never Used  Substance and Sexual Activity   Alcohol use: No   Drug use: No   Sexual activity: Yes    Birth control/protection: Pill  Other Topics Concern   Not on file  Social History Narrative   Not on file   Social Determinants of Health   Financial Resource Strain: Not on file  Food Insecurity: Not on file  Transportation Needs: Not on file  Physical Activity: Not on file  Stress: Not on file  Social Connections: Not on file  Intimate Partner Violence: Not on file    Review of Systems: See HPI, otherwise negative ROS  Physical Exam: BP 111/77   Pulse 98   Temp (!) 97.4 F (  36.3 C) (Temporal)   Wt 93 kg   SpO2 99%   BMI 32.11 kg/m  General:   Alert,  pleasant and cooperative in NAD Head:  Normocephalic and atraumatic. Neck:  Supple; no masses or thyromegaly. Lungs:  Clear throughout to auscultation.    Heart:  Regular rate and rhythm. Abdomen:  Soft, nontender and nondistended. Normal bowel sounds, without guarding, and without rebound.   Neurologic:  Alert and  oriented x4;  grossly normal neurologically.  Impression/Plan: Tracey Watkins is here for an colonoscopy to be performed for a history of adenomatous polyps on 2019   Risks,  benefits, limitations, and alternatives regarding  colonoscopy have been reviewed with the patient.  Questions have been answered.  All parties agreeable.   Midge Minium, MD  11/04/2022, 9:01 AM

## 2022-11-05 ENCOUNTER — Encounter: Payer: Self-pay | Admitting: Gastroenterology

## 2022-11-07 ENCOUNTER — Encounter: Payer: Self-pay | Admitting: Gastroenterology

## 2022-11-07 LAB — SURGICAL PATHOLOGY

## 2022-12-18 ENCOUNTER — Encounter: Payer: Self-pay | Admitting: Nurse Practitioner

## 2022-12-18 ENCOUNTER — Ambulatory Visit (INDEPENDENT_AMBULATORY_CARE_PROVIDER_SITE_OTHER): Payer: BC Managed Care – PPO | Admitting: Nurse Practitioner

## 2022-12-18 VITALS — BP 126/76 | HR 88 | Temp 98.0°F | Resp 16 | Ht 67.0 in | Wt 206.5 lb

## 2022-12-18 DIAGNOSIS — Z1159 Encounter for screening for other viral diseases: Secondary | ICD-10-CM | POA: Diagnosis not present

## 2022-12-18 DIAGNOSIS — Z13220 Encounter for screening for lipoid disorders: Secondary | ICD-10-CM | POA: Diagnosis not present

## 2022-12-18 DIAGNOSIS — Z131 Encounter for screening for diabetes mellitus: Secondary | ICD-10-CM

## 2022-12-18 DIAGNOSIS — E559 Vitamin D deficiency, unspecified: Secondary | ICD-10-CM

## 2022-12-18 DIAGNOSIS — Z130 Encounter for screening for diseases of the blood and blood-forming organs and certain disorders involving the immune mechanism: Secondary | ICD-10-CM

## 2022-12-18 DIAGNOSIS — M65332 Trigger finger, left middle finger: Secondary | ICD-10-CM

## 2022-12-18 DIAGNOSIS — J9801 Acute bronchospasm: Secondary | ICD-10-CM

## 2022-12-18 DIAGNOSIS — G5602 Carpal tunnel syndrome, left upper limb: Secondary | ICD-10-CM

## 2022-12-18 DIAGNOSIS — Z114 Encounter for screening for human immunodeficiency virus [HIV]: Secondary | ICD-10-CM

## 2022-12-18 MED ORDER — ALBUTEROL SULFATE HFA 108 (90 BASE) MCG/ACT IN AERS: 1.0000 | INHALATION_SPRAY | RESPIRATORY_TRACT | 3 refills | Status: AC | PRN

## 2022-12-18 NOTE — Progress Notes (Signed)
BP 126/76 (BP Location: Right Arm, Patient Position: Sitting, Cuff Size: Large)   Pulse 88   Temp 98 F (36.7 C) (Oral)   Resp 16   Ht 5\' 7"  (1.702 m) Comment: per patient  Wt 206 lb 8 oz (93.7 kg)   SpO2 96%   BMI 32.34 kg/m    Subjective:    Patient ID: Tracey Watkins, female    DOB: 1970-12-20, 52 y.o.   MRN: [id number]  HPI: Tracey Watkins is a 52 y.o. female  Chief Complaint  Patient presents with   Establish Care   Discussed the use of AI scribe software for clinical note transcription with the patient, who gave verbal consent to proceed.  History of Present Illness   The patient, with a history of obesity and acid reflux, is establishing care. She has been managing her acid reflux with Zantac 150mg  twice daily. She has an abnormal Pap smear and is due for a repeat in a year. She is up to date on colonoscopy and mammogram. She has had her gallbladder removed and tubes tied. She has no known history of blood pressure issues or diabetes.  The patient's mother passed away from ALS two years ago, and the patient is concerned about potential symptoms. She reports persistent pain in her hand and occasional locking of her middle finger, which she attributes to potential carpal tunnel syndrome. She has had nerve testing done in the past, but not recently. She occasionally uses a brace for her arm, which provides some relief.  The patient's father has a history of diabetes, heart disease, and is overweight. There is also a family history of various types of cancer, including breast cancer in the patient's grandmother and lymphoma, non-Hodgkin's, and another type of cancer in the patient's oldest brother. Another brother has an anaplastic brain tumor.  The patient uses an albuterol inhaler for occasional breathing difficulties during weather changes, but she does not have a diagnosis of asthma. She also takes vitamin D and had her levels checked approximately two years ago.      12/18/2022    10:09 AM  Depression screen PHQ 2/9  Decreased Interest 0  Down, Depressed, Hopeless 0  PHQ - 2 Score 0  Altered sleeping 1  Tired, decreased energy 2  Change in appetite 0  Feeling bad or failure about yourself  0  Trouble concentrating 0  Moving slowly or fidgety/restless 0  Suicidal thoughts 0  PHQ-9 Score 3  Difficult doing work/chores Not difficult at all    Relevant past medical, surgical, family and social history reviewed and updated as indicated. Interim medical history since our last visit reviewed. Allergies and medications reviewed and updated.  Review of Systems  Constitutional: Negative for fever or weight change.  Respiratory: Negative for cough and shortness of breath.   Cardiovascular: Negative for chest pain or palpitations.  Gastrointestinal: Negative for abdominal pain, no bowel changes.  Musculoskeletal: Negative for gait problem or joint swelling.  Skin: Negative for rash.  Neurological: Negative for dizziness or headache.  No other specific complaints in a complete review of systems (except as listed in HPI above).      Objective:    BP 126/76 (BP Location: Right Arm, Patient Position: Sitting, Cuff Size: Large)   Pulse 88   Temp 98 F (36.7 C) (Oral)   Resp 16   Ht 5\' 7"  (1.702 m) Comment: per patient  Wt 206 lb 8 oz (93.7 kg)   SpO2 96%  BMI 32.34 kg/m   Wt Readings from Last 3 Encounters:  12/18/22 206 lb 8 oz (93.7 kg)  11/04/22 205 lb (93 kg)  09/12/22 219 lb (99.3 kg)    Physical Exam  Constitutional: Patient appears well-developed and well-nourished. Obese  No distress.  HEENT: head atraumatic, normocephalic, pupils equal and reactive to light, neck supple Cardiovascular: Normal rate, regular rhythm and normal heart sounds.  No murmur heard. No BLE edema. Pulmonary/Chest: Effort normal and breath sounds normal. No respiratory distress. Abdominal: Soft.  There is no tenderness. Psychiatric: Patient has a normal mood and affect.  behavior is normal. Judgment and thought content normal.  Results for orders placed or performed during the hospital encounter of 11/04/22  Surgical pathology  Result Value Ref Range   SURGICAL PATHOLOGY      SURGICAL PATHOLOGY [facility] [address] [place], [place] [address] Telephone [phone] or [phone] Fax [phone]  REPORT OF SURGICAL PATHOLOGY   Accession #: [id number] Patient Name: Tracey Watkins, Tracey Watkins Visit # : [id number]  MRN: [id number] Physician: Midge Minium DOB/Age 08-Dec-1970 (Age: 74) Gender: F Collected Date: 11/04/2022 Received Date: 11/04/2022  FINAL DIAGNOSIS       1. Transverse Colon Polyp, cold snare :       TUBULAR ADENOMA      NEGATIVE FOR HIGH-GRADE DYSPLASIA AND CARCINOMA       2. 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 Transverse Colon Polyp, Cold Snare 2. Ascending  Colon Polyp, X2 Cold Snare   SPECIMEN COMMENTS: SPECIMEN CLINICAL INFORMATION: 1. History of colon polyps, diverticulosis, colon polyps    Gross Description 1. Received in formalin, labeled "cold snare polyp transverse colon", is a 1.0 x 0.5 x 0.2 cm aggregate of multiple soft, tan tissue fragments submitted entirely in block 1A. 2. Received in formalin, labeled "cold snare polyp x2 ascending colon", is a 1.4 x 1.2 x 0.2 cm aggregate of tan-red soft tissue fragments and food material submitted entirely in block 2A.      SMB      11/04/2022        Report signed out from the following location(s) [place] [address], [place] [address] CLIA #: [id number]  [facility] [address] [place], [place] [address] CLIA #: [id number]       Assessment & Plan:   Problem List Items Addressed This Visit       Nervous and Auditory   Carpal tunnel syndrome of left wrist   Relevant Orders   Ambulatory referral to Orthopedic Surgery   Other Visit Diagnoses     Bronchospasm    -  Primary   Relevant Medications   albuterol (VENTOLIN HFA) 108 (90 Base) MCG/ACT inhaler   Vitamin D deficiency       Relevant Orders   VITAMIN D 25 Hydroxy (Vit-D Deficiency, Fractures)   Screening for diabetes mellitus       Relevant Orders   COMPLETE METABOLIC PANEL WITH GFR   Hemoglobin A1c   Encounter for hepatitis C screening test for low risk patient       Relevant Orders   Hepatitis C  antibody   Screening for HIV without presence of risk factors       Relevant Orders   HIV Antibody (routine testing w rflx)   Screening for cholesterol level       Relevant Orders   Lipid panel   Screening for deficiency anemia       Relevant Orders   CBC with Differential/Platelet   Trigger middle finger of left hand       Relevant Orders   Ambulatory referral to Orthopedic Surgery       Assessment and Plan    Obesity BMI of 32.34, no other comorbidities mentioned. -Continue current lifestyle modifications.  Gastroesophageal Reflux Disease (GERD) Managed with Zantac 150mg  twice daily. -Continue current medication.  Abnormal Pap Smear Repeat Pap smear due in a year. -Follow up with gynecologist for repeat Pap smear as recommended.  Family History of ALS Patient's mother diagnosed at age 19, patient currently 1 with concerns about hand pain and stiffness. -Referral to orthopedics for evaluation of hand pain and possible trigger finger.  Seasonal Breathing Difficulty Albuterol inhaler used as needed, not used this year. -Order new Albuterol inhaler to [facility]. -Continue current management.  General Health Maintenance -Complete blood work today. -Follow up in a year  unless abnormal results on blood work. -Continue regular screenings (mammogram, colonoscopy) with respective specialists.         Follow up plan: Return in about 1 year (around 12/18/2023) for cpe.

## 2022-12-19 LAB — CBC WITH DIFFERENTIAL/PLATELET
Absolute Lymphocytes: 926 {cells}/uL (ref 850–3900)
Absolute Monocytes: 523 {cells}/uL (ref 200–950)
Basophils Absolute: 48 {cells}/uL (ref 0–200)
Basophils Relative: 1 %
Eosinophils Absolute: 230 {cells}/uL (ref 15–500)
Eosinophils Relative: 4.8 %
HCT: 40.4 % (ref 35.0–45.0)
Hemoglobin: 13.6 g/dL (ref 11.7–15.5)
MCH: 32.6 pg (ref 27.0–33.0)
MCHC: 33.7 g/dL (ref 32.0–36.0)
MCV: 96.9 fL (ref 80.0–100.0)
MPV: 11 fL (ref 7.5–12.5)
Monocytes Relative: 10.9 %
Neutro Abs: 3072 {cells}/uL (ref 1500–7800)
Neutrophils Relative %: 64 %
Platelets: 206 10*3/uL (ref 140–400)
RBC: 4.17 10*6/uL (ref 3.80–5.10)
RDW: 12.3 % (ref 11.0–15.0)
Total Lymphocyte: 19.3 %
WBC: 4.8 10*3/uL (ref 3.8–10.8)

## 2022-12-19 LAB — HEMOGLOBIN A1C
Hgb A1c MFr Bld: 5.5 %{Hb} (ref <5.7)
Mean Plasma Glucose: 111 mg/dL
eAG (mmol/L): 6.2 mmol/L

## 2022-12-19 LAB — COMPLETE METABOLIC PANEL WITH GFR
AG Ratio: 1.6 (calc) (ref 1.0–2.5)
ALT: 31 U/L — ABNORMAL HIGH (ref 6–29)
AST: 20 U/L (ref 10–35)
Albumin: 4.2 g/dL (ref 3.6–5.1)
Alkaline phosphatase (APISO): 90 U/L (ref 37–153)
BUN/Creatinine Ratio: 14 (calc) (ref 6–22)
BUN: 15 mg/dL (ref 7–25)
CO2: 27 mmol/L (ref 20–32)
Calcium: 9.2 mg/dL (ref 8.6–10.4)
Chloride: 105 mmol/L (ref 98–110)
Creat: 1.05 mg/dL — ABNORMAL HIGH (ref 0.50–1.03)
Globulin: 2.7 g/dL (ref 1.9–3.7)
Glucose, Bld: 101 mg/dL — ABNORMAL HIGH (ref 65–99)
Potassium: 4.6 mmol/L (ref 3.5–5.3)
Sodium: 139 mmol/L (ref 135–146)
Total Bilirubin: 0.3 mg/dL (ref 0.2–1.2)
Total Protein: 6.9 g/dL (ref 6.1–8.1)
eGFR: 63 mL/min/{1.73_m2} (ref >=60)

## 2022-12-19 LAB — VITAMIN D 25 HYDROXY (VIT D DEFICIENCY, FRACTURES): Vit D, 25-Hydroxy: 41 ng/mL (ref 30–100)

## 2022-12-19 LAB — LIPID PANEL
Cholesterol: 169 mg/dL (ref <200)
HDL: 44 mg/dL — ABNORMAL LOW (ref >=50)
LDL Cholesterol (Calc): 103 mg/dL — ABNORMAL HIGH
Non-HDL Cholesterol (Calc): 125 mg/dL (ref <130)
Total CHOL/HDL Ratio: 3.8 (calc) (ref <5.0)
Triglycerides: 128 mg/dL (ref <150)

## 2022-12-19 LAB — HIV ANTIBODY (ROUTINE TESTING W REFLEX): HIV 1&2 Ab, 4th Generation: NONREACTIVE

## 2022-12-19 LAB — HEPATITIS C ANTIBODY: Hepatitis C Ab: NONREACTIVE

## 2023-04-08 DIAGNOSIS — R109 Unspecified abdominal pain: Secondary | ICD-10-CM | POA: Diagnosis not present

## 2023-06-12 ENCOUNTER — Encounter: Payer: Self-pay | Admitting: Nurse Practitioner

## 2023-06-12 ENCOUNTER — Ambulatory Visit (INDEPENDENT_AMBULATORY_CARE_PROVIDER_SITE_OTHER): Admitting: Nurse Practitioner

## 2023-06-12 VITALS — BP 114/68 | HR 102 | Temp 98.3°F | Ht 67.0 in | Wt 198.6 lb

## 2023-06-12 DIAGNOSIS — L989 Disorder of the skin and subcutaneous tissue, unspecified: Secondary | ICD-10-CM | POA: Diagnosis not present

## 2023-06-12 NOTE — Progress Notes (Signed)
 BP 114/68 (BP Location: Left Arm, Patient Position: Sitting, Cuff Size: Normal)   Pulse (!) 102   Temp 98.3 F (36.8 C) (Oral)   Ht 5\' 7"  (1.702 m)   Wt 198 lb 9.6 oz (90.1 kg)   SpO2 97%   BMI 31.11 kg/m    Subjective:    Patient ID: Tracey Watkins, female    DOB: 07-10-1970, 53 y.o.   MRN: [id number]  HPI: AZRIELLA Watkins is a 53 y.o. female  Chief Complaint  Patient presents with   Mass    R calf, itches/ burns, onset 6 mo, feels hard    Discussed the use of AI scribe software for clinical note transcription with the patient, who gave verbal consent to proceed.  History of Present Illness Tracey Watkins is a 53 year old female who presents with a painful and stinging lesion on her right leg.  She has experienced a painful and stinging spot on her right leg, just below the calf, for approximately six months. Initially, she thought it was related to shaving, but it has not resolved and has become hard and tender. The sensation is described as burning at times and feels odd.  She is concerned about the possibility of skin cancer, especially given her significant family history of various cancers. Her cousin advised her to have it checked, prompting her to seek evaluation.          06/12/2023   10:00 AM 12/18/2022   10:09 AM  Depression screen PHQ 2/9  Decreased Interest 0 0  Down, Depressed, Hopeless 0 0  PHQ - 2 Score 0 0  Altered sleeping 0 1  Tired, decreased energy 0 2  Change in appetite 0 0  Feeling bad or failure about yourself  0 0  Trouble concentrating 0 0  Moving slowly or fidgety/restless 0 0  Suicidal thoughts 0 0  PHQ-9 Score 0 3  Difficult doing work/chores Not difficult at all Not difficult at all    Relevant past medical, surgical, family and social history reviewed and updated as indicated. Interim medical history since our last visit reviewed. Allergies and medications reviewed and updated.  Review of Systems  Ten systems reviewed and is negative  except as mentioned in HPI      Objective:      BP 114/68 (BP Location: Left Arm, Patient Position: Sitting, Cuff Size: Normal)   Pulse (!) 102   Temp 98.3 F (36.8 C) (Oral)   Ht 5\' 7"  (1.702 m)   Wt 198 lb 9.6 oz (90.1 kg)   SpO2 97%   BMI 31.11 kg/m    Wt Readings from Last 3 Encounters:  06/12/23 198 lb 9.6 oz (90.1 kg)  12/18/22 206 lb 8 oz (93.7 kg)  11/04/22 205 lb (93 kg)    Physical Exam Vitals reviewed.  Constitutional:      Appearance: Normal appearance.  HENT:     Head: Normocephalic.  Cardiovascular:     Rate and Rhythm: Normal rate.  Pulmonary:     Effort: Pulmonary effort is normal.  Skin:    Findings: Lesion present.     Comments: Lower extremity, 1mm x 2mm hard lesion  Neurological:     General: No focal deficit present.     Mental Status: She is alert and oriented to person, place, and time. Mental status is at baseline.  Psychiatric:        Mood and Affect: Mood normal.  Behavior: Behavior normal.        Thought Content: Thought content normal.        Judgment: Judgment normal.       Results for orders placed or performed in visit on 12/18/22  CBC with Differential/Platelet   Collection Time: 12/18/22 10:39 AM  Result Value Ref Range   WBC 4.8 3.8 - 10.8 Thousand/uL   RBC 4.17 3.80 - 5.10 Million/uL   Hemoglobin 13.6 11.7 - 15.5 g/dL   HCT 54.6 27.0 - 35.0 %   MCV 96.9 80.0 - 100.0 fL   MCH 32.6 27.0 - 33.0 pg   MCHC 33.7 32.0 - 36.0 g/dL   RDW 09.3 81.8 - 29.9 %   Platelets 206 140 - 400 Thousand/uL   MPV 11.0 7.5 - 12.5 fL   Neutro Abs 3,072 1,500 - 7,800 cells/uL   Absolute Lymphocytes 926 850 - 3,900 cells/uL   Absolute Monocytes 523 200 - 950 cells/uL   Eosinophils Absolute 230 15 - 500 cells/uL   Basophils Absolute 48 0 - 200 cells/uL   Neutrophils Relative % 64 %   Total Lymphocyte 19.3 %   Monocytes Relative 10.9 %   Eosinophils Relative 4.8 %   Basophils Relative 1.0 %  COMPLETE METABOLIC PANEL WITH GFR    Collection Time: 12/18/22 10:39 AM  Result Value Ref Range   Glucose, Bld 101 (H) 65 - 99 mg/dL   BUN 15 7 - 25 mg/dL   Creat 3.71 (H) 6.96 - 1.03 mg/dL   eGFR 63 > OR = 60 VE/LFY/1.01B5   BUN/Creatinine Ratio 14 6 - 22 (calc)   Sodium 139 135 - 146 mmol/L   Potassium 4.6 3.5 - 5.3 mmol/L   Chloride 105 98 - 110 mmol/L   CO2 27 20 - 32 mmol/L   Calcium 9.2 8.6 - 10.4 mg/dL   Total Protein 6.9 6.1 - 8.1 g/dL   Albumin 4.2 3.6 - 5.1 g/dL   Globulin 2.7 1.9 - 3.7 g/dL (calc)   AG Ratio 1.6 1.0 - 2.5 (calc)   Total Bilirubin 0.3 0.2 - 1.2 mg/dL   Alkaline phosphatase (APISO) 90 37 - 153 U/L   AST 20 10 - 35 U/L   ALT 31 (H) 6 - 29 U/L  Lipid panel   Collection Time: 12/18/22 10:39 AM  Result Value Ref Range   Cholesterol 169 <200 mg/dL   HDL 44 (L) > OR = 50 mg/dL   Triglycerides 102 <585 mg/dL   LDL Cholesterol (Calc) 103 (H) mg/dL (calc)   Total CHOL/HDL Ratio 3.8 <5.0 (calc)   Non-HDL Cholesterol (Calc) 125 <130 mg/dL (calc)  Hepatitis C antibody   Collection Time: 12/18/22 10:39 AM  Result Value Ref Range   Hepatitis C Ab NON-REACTIVE NON-REACTIVE  HIV Antibody (routine testing w rflx)   Collection Time: 12/18/22 10:39 AM  Result Value Ref Range   HIV 1&2 Ab, 4th Generation NON-REACTIVE NON-REACTIVE  Hemoglobin A1c   Collection Time: 12/18/22 10:39 AM  Result Value Ref Range   Hgb A1c MFr Bld 5.5 <5.7 % of total Hgb   Mean Plasma Glucose 111 mg/dL   eAG (mmol/L) 6.2 mmol/L  VITAMIN D  25 Hydroxy (Vit-D Deficiency, Fractures)   Collection Time: 12/18/22 10:39 AM  Result Value Ref Range   Vit D, 25-Hydroxy 41 30 - 100 ng/mL          Assessment & Plan:   Problem List Items Addressed This Visit   None Visit Diagnoses  Skin lesion of right lower extremity    -  Primary   Relevant Orders   Ambulatory referral to Dermatology        Assessment and Plan Assessment & Plan Skin lesion on right leg Chronic skin lesion on the right leg, present for  approximately six months. The lesion is hard, tender, and occasionally burning. Differential diagnosis includes skin cancer, given her family history of various cancers. The lesion's characteristics warrant further evaluation by a dermatologist. - Refer to dermatology for evaluation and possible excision of the skin lesion. - Take a photograph of the lesion for documentation. - Instruct her to expect a phone call to schedule the dermatology appointment. - Provide the phone number for the dermatology office for follow-up if not contacted by midweek.        Follow up plan: Return if symptoms worsen or fail to improve.

## 2023-06-27 DIAGNOSIS — R42 Dizziness and giddiness: Secondary | ICD-10-CM | POA: Diagnosis not present

## 2023-06-27 DIAGNOSIS — R202 Paresthesia of skin: Secondary | ICD-10-CM | POA: Diagnosis not present

## 2023-06-27 DIAGNOSIS — R0602 Shortness of breath: Secondary | ICD-10-CM | POA: Diagnosis not present

## 2023-06-27 DIAGNOSIS — R079 Chest pain, unspecified: Secondary | ICD-10-CM | POA: Diagnosis not present

## 2023-06-27 DIAGNOSIS — R072 Precordial pain: Secondary | ICD-10-CM | POA: Diagnosis not present

## 2023-06-27 DIAGNOSIS — R531 Weakness: Secondary | ICD-10-CM | POA: Diagnosis not present

## 2023-06-27 DIAGNOSIS — R110 Nausea: Secondary | ICD-10-CM | POA: Diagnosis not present

## 2023-06-28 DIAGNOSIS — R072 Precordial pain: Secondary | ICD-10-CM | POA: Diagnosis not present

## 2023-07-01 ENCOUNTER — Telehealth: Payer: Self-pay | Admitting: Nurse Practitioner

## 2023-07-01 ENCOUNTER — Ambulatory Visit (INDEPENDENT_AMBULATORY_CARE_PROVIDER_SITE_OTHER): Admitting: Family Medicine

## 2023-07-01 ENCOUNTER — Encounter: Payer: Self-pay | Admitting: Family Medicine

## 2023-07-01 VITALS — BP 122/70 | HR 74 | Resp 16 | Ht 67.0 in | Wt 196.0 lb

## 2023-07-01 DIAGNOSIS — Z09 Encounter for follow-up examination after completed treatment for conditions other than malignant neoplasm: Secondary | ICD-10-CM

## 2023-07-01 DIAGNOSIS — R079 Chest pain, unspecified: Secondary | ICD-10-CM | POA: Diagnosis not present

## 2023-07-01 MED ORDER — PANTOPRAZOLE SODIUM 40 MG PO TBEC: 40.0000 mg | DELAYED_RELEASE_TABLET | Freq: Every day | ORAL | 0 refills | Status: DC

## 2023-07-01 NOTE — Telephone Encounter (Signed)
 Refilled today

## 2023-07-01 NOTE — Progress Notes (Signed)
 Name: Tracey Watkins   MRN: [id number]    DOB: 01-17-1970   Date:07/01/2023       Progress Note  Chief Complaint  Patient presents with   Hospitalization Follow-up    Chest pain. Sharp pain in chest and arm tingling. EKG/Labs normal in ED. Recommended stress test     Subjective:   Tracey Watkins is a 53 y.o. female, presents to clinic for ED f/up and need for referral to cardiology for further eval of CP  Reviewed ED visit to [facility] on 6/14 for CP Labs reviewed neg lipase and serial troponin  ECG: NORMAL SINUS RHYTHM  NONSPECIFIC T WAVE ABNORMALITY  WHEN COMPARED WITH ECG OF 27-Jun-2023 19:33,  NO SIGNIFICANT CHANGE WAS FOUND  Confirmed by Markel Silber ([id number]) on 06/28/2023 2:30:08 PM   CXR: [name]: XR CHEST 2 VIEWS  ACCESSION: [id number]  REPORT DATE: 06/27/2023 9:21 PM  CLINICAL INDICATION: CHEST PAIN ; Chest Pain   TECHNIQUE: PA and Lateral Chest Radiographs  COMPARISON: None  FINDINGS:  Lungs are clear.  No pleural effusion or pneumothorax.  Normal heart size and mediastinal contours.  Moderate degenerative changes of the glenohumeral and acromioclavicular joints.  Cholecystectomy.   Pt initially presented to the ED with SOB, nausea and left sided CP She was at work doing normal function as a Production designer, theatre/television/film, walking around no exertion she states, she went to sit down and took of drink of something and had sudden onset of left sided sharp stabbing chest pain tingling SOB went pale EMS was called she was checked and then she got herself to [facility]  At first she stated no pain similar to what she went to the ER for, no tachycardia, SOB, pain with breathing or exertional sx, but then after asking more questions she states she has persistent dull pain decribed as tight and pressure across upper chest, shoulder to shoulder which radiates to arms/hands bilaterally and does get worse with exertion with associated clammy and sweatiness and weakness No passing out no  palpitations  She has hx of gerd but has not noted any worse reflux/indigestion/abd pain    Current Outpatient Medications:    cholecalciferol (VITAMIN D3) 25 MCG (1000 UNIT) tablet, Take 1,000 Units by mouth daily., Disp: , Rfl:    ranitidine (ZANTAC) 150 MG tablet, Take 150 mg by mouth 2 (two) times daily., Disp: , Rfl:    albuterol  (VENTOLIN  HFA) 108 (90 Base) MCG/ACT inhaler, Inhale 1-2 puffs into the lungs every 4 (four) hours as needed for wheezing or shortness of breath. (Patient not taking: Reported on 07/01/2023), Disp: 8 g, Rfl: 3  Patient Active Problem List   Diagnosis Date Noted   Polyp of ascending colon 11/04/2022   History of colonic polyps    Adenoma determined by colorectal biopsy 11/18/2016   Colon cancer screening    Benign neoplasm of cecum    Screen for colon cancer    S/P laparoscopic cholecystectomy 02/29/2016   Allergic reaction 02/29/2016   Carpal tunnel syndrome of left wrist 10/03/2014    Past Surgical History:  Procedure Laterality Date   CHOLECYSTECTOMY N/A 02/27/2016   Procedure: LAPAROSCOPIC CHOLECYSTECTOMY;  Surgeon: Alben Alma, MD;  Location: [facility] ORS;  Service: General;  Laterality: N/A;   COLONOSCOPY WITH PROPOFOL  N/A 09/16/2016   Procedure: COLONOSCOPY WITH PROPOFOL ;  Surgeon: Marnee Sink, MD;  Location: [facility] ENDOSCOPY;  Service: Endoscopy;  Laterality: N/A;   COLONOSCOPY WITH PROPOFOL  N/A 10/07/2016   Procedure: COLONOSCOPY WITH PROPOFOL ;  Surgeon: Marnee Sink, MD;  Location: [facility] ENDOSCOPY;  Service: Endoscopy;  Laterality: N/A;   COLONOSCOPY WITH PROPOFOL  N/A 09/29/2017   Procedure: COLONOSCOPY WITH PROPOFOL ;  Surgeon: Marnee Sink, MD;  Location[facility] END[facility]COPY;  Service: Endoscopy;  Laterality: N/A;   COLONOSCOPY WITH PROPOFOL  N/A 11/04/2022   Procedure: COLONOSCOPY WITH PROPOFOL ;  Surgeon: Marnee Sink, MD;  Location: [facility] ENDOSCOPY;  Service: Endoscopy;  Laterality: N/A;   DIAGNOSTIC LAPAROSCOPY     PLANTAR FASCIA RELEASE Left 10/08/2017    Procedure: PLANTAR FASCIA RELEASE - WITH TOPAZ;  Surgeon: Sharlyn Deaner, DPM;  Location: [facility];  Service: Podiatry;  Laterality: Left;  TOPAZ ENDOSCOPIC PLANTAR FASC SET LMA WITH EXPRELL   POLYPECTOMY  11/04/2022   Procedure: POLYPECTOMY;  Surgeon: Marnee Sink, MD;  Location: [facility] ENDOSCOPY;  Service: Endoscopy;;   TUBAL LIGATION      Family History  Problem Relation Age of Onset   ALS Mother    Dementia Mother    Hypertension Father    Diabetes Father    Gout Father    Other Brother    Cancer Brother    Breast cancer Paternal Grandmother 58    Social History   Tobacco Use   Smoking status: Never   Smokeless tobacco: Never  Vaping Use   Vaping status: Never Used  Substance Use Topics   Alcohol use: No   Drug use: No     Allergies  Allergen Reactions   Skin Adhesives [Cyanoacrylate] Rash    Dermabond    Health Maintenance  Topic Date Due   COVID-19 Vaccine (5 - 2024-25 season) 07/17/2023 (Originally 09/14/2022)   Zoster Vaccines- Shingrix (1 of 2) 09/12/2023 (Originally 02/27/2018)   DTaP/Tdap/Td (1 - Tdap) 12/18/2023 (Originally 02/28/1987)   Pneumococcal Vaccine 8-61 Years old (1 of 2 - PCV) 06/30/2024 (Originally 02/28/1987)   INFLUENZA VACCINE  08/14/2023   MAMMOGRAM  10/20/2023   Colonoscopy  11/04/2023   Cervical Cancer Screening (HPV/Pap Cotest)  09/12/2027   Hepatitis C Screening  Completed   HIV Screening  Completed   HPV VACCINES  Aged Out   Meningococcal B Vaccine  Aged Out    Chart Review Today: I personally reviewed active problem list, medication list, allergies, family history, social history, health maintenance, notes from last encounter, lab results, imaging with the patient/caregiver today.   Review of Systems  Constitutional: Negative.   HENT: Negative.    Eyes: Negative.   Respiratory: Negative.    Cardiovascular: Negative.   Gastrointestinal: Negative.   Endocrine: Negative.   Genitourinary: Negative.    Musculoskeletal: Negative.   Skin: Negative.   Allergic/Immunologic: Negative.   Neurological: Negative.   Hematological: Negative.   Psychiatric/Behavioral: Negative.    All other systems reviewed and are negative.    Objective:   Vitals:   07/01/23 0948  BP: 122/70  Pulse: 74  Resp: 16  SpO2: 96%  Weight: 196 lb (88.9 kg)  Height: 5' 7 (1.702 m)    Body mass index is 30.7 kg/m.  Physical Exam Vitals and nursing note reviewed.  Constitutional:      General: She is not in acute distress.    Appearance: Normal appearance. She is well-developed. She is not ill-appearing, toxic-appearing or diaphoretic.  HENT:     Head: Normocephalic and atraumatic.     Right Ear: External ear normal.     Left Ear: External ear normal.     Nose: Nose normal.   Eyes:     General: No scleral icterus.  Right eye: No discharge.        Left eye: No discharge.     Conjunctiva/sclera: Conjunctivae normal.   Neck:     Trachea: No tracheal deviation.   Cardiovascular:     Rate and Rhythm: Normal rate and regular rhythm. No extrasystoles are present.    Chest Wall: PMI is not displaced.     Pulses: Normal pulses.          Radial pulses are 2+ on the right side and 2+ on the left side.     Heart sounds: Normal heart sounds. No murmur heard.    No friction rub. No gallop.  Pulmonary:     Effort: Pulmonary effort is normal. No tachypnea, accessory muscle usage or respiratory distress.     Breath sounds: Normal breath sounds. No stridor, decreased air movement or transmitted upper airway sounds. No decreased breath sounds, wheezing, rhonchi or rales.   Musculoskeletal:     Right lower leg: No edema.     Left lower leg: No edema.   Skin:    General: Skin is warm and dry.     Findings: No rash.   Neurological:     Mental Status: She is alert.     Motor: No abnormal muscle tone.     Coordination: Coordination normal.     Gait: Gait normal.   Psychiatric:        Mood and  Affect: Mood normal.        Behavior: Behavior normal.          Assessment & Plan:     ICD-10-CM   1. Hospital discharge follow-up  Z09    reviewed [facility] ED encounter all results, notes in care everywhere    2. Chest pain, unspecified type  R07.9 pantoprazole (PROTONIX) 40 MG tablet    Ambulatory referral to Cardiology    Check Pulse Oximetry while ambulating    CANCELED: Ambulatory referral to Cardiology   sharp sudden onset w/o return of similar pain, ED work up neg including CXR, ECG and trop x 2, trial of PPI and cardiology f/up    3. Exertional chest pain  R07.9 Check Pulse Oximetry while ambulating   she denies recurrence of severe sharp CP but endorses pressure/tightness across upper chest worse with exertion The ED did not do d-dimer or CTA HR normal today, ambulatory pulse ox reassuring, no tachycardia, CP, hypoxia with walking multiple laps in clinic, doubt PE (Wells criteria and PERC neg), possible GI etiology or pleurisy?  Trial PPI and refer to cardiology for further eval       07/01/23 1041  Resting  Supplemental oxygen  during test? No  Resting Heart Rate 69  Resting Sp02 97  Lap 1 (250 feet)  HR 87  02 Sat 96  Lap 2 (250 feet)  HR 60  02 Sat 95  Lap 3 (250 feet)  HR 83  02 Sat 97    F/up cardiology for CP  If cardiology determines non-cardiac etiology of CP, pt should then f/up with her PCP in office in the next 3-4 weeks  Adeline Hone, PA-C 07/01/23 10:05 AM

## 2023-07-01 NOTE — Telephone Encounter (Signed)
 pantoprazole (PROTONIX) 40 MG tablet   Requesting 90 day supply

## 2023-07-28 ENCOUNTER — Other Ambulatory Visit: Payer: Self-pay | Admitting: Family Medicine

## 2023-07-28 ENCOUNTER — Telehealth: Payer: Self-pay

## 2023-07-28 DIAGNOSIS — R079 Chest pain, unspecified: Secondary | ICD-10-CM

## 2023-07-28 NOTE — Telephone Encounter (Signed)
Refill request on pantoprazole (PROTONIX) 40 MG tablet.

## 2023-07-29 NOTE — Telephone Encounter (Signed)
 Requested medication (s) are due for refill today -unsure  Requested medication (s) are on the active medication list -yes  Future visit scheduled -no  Last refill: 07/01/23 #28  Notes to clinic: original Rx refilled by provider #[id number] with no RF- patient requesting 90 day supply- sent for review   Requested Prescriptions  Pending Prescriptions Disp Refills   pantoprazole  (PROTONIX ) 40 MG tablet [Pharmacy Med Name: PANTOPRAZOLE  40MG  TABLETS] 90 tablet     Sig: TAKE 1 TABLET(40 MG) BY MOUTH DAILY     Gastroenterology: Proton Pump Inhibitors Passed - 07/29/2023 12:30 PM      Passed - Valid encounter within last 12 months    Recent Outpatient Visits           4 weeks ago Hospital discharge follow-up   [facility] Leavy Mole, PA-C   1 month ago Skin lesion of right lower extremity   [facility] Gareth Mliss FALCON, FNP       Future Appointments             In 2 weeks Orman Erminio MARLA RIGGERS [facility]   In 1 month Perla, Evalene PARAS, MD [facility]               Requested Prescriptions  Pending Prescriptions Disp Refills   pantoprazole  (PROTONIX ) 40 MG tablet [Pharmacy Med Name: PANTOPRAZOLE  40MG  TABLETS] 90 tablet     Sig: TAKE 1 TABLET(40 MG) BY MOUTH DAILY     Gastroenterology: Proton Pump Inhibitors Passed - 07/29/2023 12:30 PM      Passed - Valid encounter within last 12 months    Recent Outpatient Visits           4 weeks ago Hospital discharge follow-up   [facility] Leavy Mole, PA-C   1 month ago Skin lesion of right lower extremity   [facility] Gareth Mliss FALCON, FNP       Future Appointments             In 2 weeks Sandridge, Brenda K, PA-C [facility]   In 1 month Gollan, Evalene PARAS, MD [facility] at [facility]

## 2023-08-03 ENCOUNTER — Ambulatory Visit: Admitting: Orthopedic Surgery

## 2023-08-12 ENCOUNTER — Ambulatory Visit: Admitting: Physician Assistant

## 2023-08-12 ENCOUNTER — Encounter: Payer: Self-pay | Admitting: Physician Assistant

## 2023-08-12 DIAGNOSIS — D239 Other benign neoplasm of skin, unspecified: Secondary | ICD-10-CM | POA: Diagnosis not present

## 2023-08-12 NOTE — Progress Notes (Signed)
   New Patient Visit   Subjective  Tracey Watkins is a NEW 53 y.o. female who presents for the following: Spot  C/o spot on right calf. Has been present for about 6 months. Itches sometimes. It did bleed once when she was shaving.    The patient has spots, moles and lesions to be evaluated, some may be new or changing and the patient may have concern these could be cancer.    The following portions of the chart were reviewed this encounter and updated as appropriate: medications, allergies, medical history  Review of Systems:  No other skin or systemic complaints except as noted in HPI or Assessment and Plan.  Objective  Well appearing patient in no apparent distress; mood and affect are within normal limits.  A focused examination was performed of the following areas: Both legs   Relevant physical exam findings are noted in the Assessment and Plan.    Assessment & Plan   DERMATOFIBROMA Exam: Firm pink/brown papulenodule with dimple sign.  Treatment Plan: A dermatofibroma is a benign growth possibly related to trauma, such as an insect bite, cut from shaving, or inflamed acne-type bump.  Treatment options to remove include shave or excision with resulting scar and risk of recurrence.  Since benign-appearing and not bothersome, will observe for now.   DERMATOFIBROMA     Return if symptoms worsen or fail to improve.  I, Gordan Beams, CMA, am acting as scribe for Dionisia Pacholski K, PA-C.   Documentation: I have reviewed the above documentation for accuracy and completeness, and I agree with the above.  Cristhian Vanhook K, PA-C

## 2023-08-27 ENCOUNTER — Ambulatory Visit: Admitting: Orthopedic Surgery

## 2023-08-28 ENCOUNTER — Other Ambulatory Visit: Payer: Self-pay | Admitting: Orthopedic Surgery

## 2023-08-28 DIAGNOSIS — M79642 Pain in left hand: Secondary | ICD-10-CM

## 2023-08-29 DIAGNOSIS — R079 Chest pain, unspecified: Secondary | ICD-10-CM | POA: Insufficient documentation

## 2023-08-29 NOTE — Progress Notes (Unsigned)
 Cardiology Office Note  Date:  08/31/2023   ID:  Tracey Watkins, DOB 1970/10/12, MRN [id number]  PCP:  Gareth Mliss FALCON, FNP   Chief Complaint  Patient presents with   New Patient (Initial Visit)    Ref by Mliss Gareth, FNP for follow up of chest pain. Patient c/o chest pain/shortness of breath that radiated across her body with a tingling sensation down her arms on 06/27/2023. Patient denies any further spells as above.     HPI:  Tracey Watkins a 53 y.o. female with past medical history of: Reflux Who presents by referral from Michelene Cower for consultation of her chest pain  Reports in June 27, 2023 she was at [employer], [employer], technical sales, had finished eating when she developed chest pain shortness of breath, nausea,  Was sitting down, took a drink, had sudden onset left sharp chest pain Tingling, shortness of breath, went pale,  Husband took her to [facility] Chest pain described as 'knife was stabbing through me'. This pain was accompanied by tingling in both hands, clamminess, and a feeling of weakness, making it difficult for her to stand. She also experienced shortness of breath during the episode.  Workup in the ER was negative  No sx since then, active at baseline  Rare tingling in the chest and legs BP low today No orthostasis sx  Family hx: father CAD  EKG personally reviewed by myself on todays visit EKG Interpretation Date/Time:  Monday August 31 2023 10:59:39 EDT Ventricular Rate:  73 PR Interval:  174 QRS Duration:  88 QT Interval:  422 QTC Calculation: 464 R Axis:   4  Text Interpretation: Normal sinus rhythm Normal ECG When compared with ECG of 27-Feb-2016 04:13, No significant change was found Confirmed by Perla Lye [id number]) on 08/31/2023 11:20:07 AM    PMH:   has a past medical history of Carpal tunnel syndrome, Cholecystitis, Endometriosis, and GERD (gastroesophageal reflux disease).  PSH:    Past Surgical History:  Procedure Laterality Date    CHOLECYSTECTOMY N/A 02/27/2016   Procedure: LAPAROSCOPIC CHOLECYSTECTOMY;  Surgeon: Laneta FALCON Luna, MD;  Location: [facility] ORS;  Service: General;  Laterality: N/A;   COLONOSCOPY WITH PROPOFOL  N/A 09/16/2016   Procedure: COLONOSCOPY WITH PROPOFOL ;  Surgeon: Jinny Carmine, MD;  Location: [facility] ENDOSCOPY;  Service: Endoscopy;  Laterality: N/A;   COLONOSCOPY WITH PROPOFOL  N/A 10/07/2016   Procedure: COLONOSCOPY WITH PROPOFOL ;  Surgeon: Jinny Carmine, MD;  Location: [facility] ENDOSCOPY;  Service: Endoscopy;  Laterality: N/A;   COLONOSCOPY WITH PROPOFOL  N/A 09/29/2017   Procedure: COLONOSCOPY WITH PROPOFOL ;  Surgeon: Jinny Carmine, MD;  Location: [facility] ENDOS[facility];  Service: Endoscopy;  Laterality: N/A;   COLONOSCOPY WITH PROPOFOL  N/A 11/04/2022   Procedure: COLONOSCOPY WITH PROPOFOL ;  Surgeon: Jinny Carmine, MD;  Location: [facility] ENDOSCOPY;  Service: Endoscopy;  Laterality: N/A;   DIAGNOSTIC LAPAROSCOPY     PLANTAR FASCIA RELEASE Left 10/08/2017   Procedure: PLANTAR FASCIA RELEASE - WITH TOPAZ;  Surgeon: Lilli Cough, DPM;  Location[facility];  Service: Podiatry;  Laterality: Left;  TOPAZ ENDOSCOPIC PLANTAR FASC SET LMA WITH EXPRELL   POLYPECTOMY  11/04/2022   Procedure: POLYPECTOMY;  Surgeon: Jinny Carmine, MD;  Location: [facility] END[facility];  Service: Endoscopy;;   TUBAL LIGATION      Current Outpatient Medications  Medication Sig Dispense Refill   albuterol  (VENTOLIN  HFA) 108 (90 Base) MCG/ACT inhaler Inhale 1-2 puffs into the lungs every 4 (four) hours as needed for wheezing or shortness of breath. 8 g 3  cholecalciferol (VITAMIN D3) 25 MCG (1000 UNIT) tablet Take 1,000 Units by mouth daily.     ranitidine (ZANTAC) 150 MG tablet Take 150 mg by mouth 2 (two) times daily.     pantoprazole  (PROTONIX ) 40 MG tablet Take 1 tablet (40 mg total) by mouth daily for 28 days. (Patient not taking: Reported on 08/31/2023) 28 tablet 0   No current facility-administered medications for this visit.   Allergies:   Skin  adhesives [cyanoacrylate]   Social History:  The patient  reports that she has never smoked. She has never used smokeless tobacco. She reports that she does not drink alcohol and does not use drugs.   Family History:   family history includes ALS in her mother; Breast cancer (age of onset: 42) in her paternal grandmother; Cancer in her brother; Dementia in her mother; Diabetes in her father; Gout in her father; Heart disease in her father and sister; Hypertension in her father; Other in her brother.    Review of Systems: Review of Systems  Constitutional: Negative.   HENT: Negative.    Respiratory: Negative.    Cardiovascular:  Positive for chest pain.  Gastrointestinal: Negative.   Musculoskeletal: Negative.   Neurological: Negative.   Psychiatric/Behavioral: Negative.    All other systems reviewed and are negative.   PHYSICAL EXAM: VS:  BP 90/72 (BP Location: Right Arm, Patient Position: Sitting, Cuff Size: Normal)   Pulse 73   Ht 5' 7 (1.702 m)   Wt 190 lb 6 oz (86.4 kg)   SpO2 98%   BMI 29.82 kg/m  , BMI Body mass index is 29.82 kg/m. GEN: Well nourished, well developed, in no acute distress HEENT: normal Neck: no JVD, carotid bruits, or masses Cardiac: RRR; no murmurs, rubs, or gallops,no edema  Respiratory:  clear to auscultation bilaterally, normal work of breathing GI: soft, nontender, nondistended, + BS MS: no deformity or atrophy Skin: warm and dry, no rash Neuro:  Strength and sensation are intact Psych: euthymic mood, full affect  Recent Labs: 12/18/2022: ALT 31; BUN 15; Creat 1.05; Hemoglobin 13.6; Platelets 206; Potassium 4.6; Sodium 139   Lipid Panel Lab Results  Component Value Date   CHOL 169 12/18/2022   HDL 44 (L) 12/18/2022   LDLCALC 103 (H) 12/18/2022   TRIG 128 12/18/2022    Wt Readings from Last 3 Encounters:  08/31/23 190 lb 6 oz (86.4 kg)  07/01/23 196 lb (88.9 kg)  06/12/23 198 lb 9.6 oz (90.1 kg)     ASSESSMENT AND PLAN:  Problem  List Items Addressed This Visit     Chest pain of uncertain etiology - Primary   Relevant Orders   EKG 12-Lead (Completed)   ECHOCARDIOGRAM COMPLETE   CT CARDIAC SCORING (SELF PAY ONLY)   Other Visit Diagnoses       Gastroesophageal reflux disease without esophagitis          Chest pain Etiology unclear, few risk factors for cardiac disease Non-smoker, no diabetes, cholesterol is not high Given family history she is interested in screening study CT calcium scoring ordered at her request Echocardiogram ordered-given severe chest pain episode in effort to rule out structural heart disease -Recommend she call us  for any recurrent episodes - We will call her with the results from testing above - No medications added at today's visit  Ms. Hedgecock was seen in consultation for Michelene Cower and will be referred back to her office for ongoing care of the issues detailed above  Signed, Tim Cedarius Kersh,  M.D., Ph.D. [facility] [place], [place] [phone]

## 2023-08-31 ENCOUNTER — Ambulatory Visit
Admission: RE | Admit: 2023-08-31 | Discharge: 2023-08-31 | Disposition: A | Discharge status: Home / Self Care | Attending: Orthopedic Surgery | Admitting: Orthopedic Surgery

## 2023-08-31 ENCOUNTER — Ambulatory Visit: Attending: Cardiovascular Disease | Admitting: Cardiovascular Disease

## 2023-08-31 ENCOUNTER — Encounter: Payer: Self-pay | Admitting: Cardiovascular Disease

## 2023-08-31 ENCOUNTER — Ambulatory Visit
Admission: RE | Admit: 2023-08-31 | Discharge: 2023-08-31 | Disposition: A | Discharge status: Home / Self Care | Source: Ambulatory Visit | Attending: Orthopedic Surgery | Admitting: Orthopedic Surgery

## 2023-08-31 VITALS — BP 90/72 | HR 73 | Ht 67.0 in | Wt 190.4 lb

## 2023-08-31 DIAGNOSIS — K219 Gastro-esophageal reflux disease without esophagitis: Secondary | ICD-10-CM

## 2023-08-31 DIAGNOSIS — M79642 Pain in left hand: Secondary | ICD-10-CM | POA: Insufficient documentation

## 2023-08-31 DIAGNOSIS — R079 Chest pain, unspecified: Secondary | ICD-10-CM | POA: Diagnosis not present

## 2023-08-31 DIAGNOSIS — M19042 Primary osteoarthritis, left hand: Secondary | ICD-10-CM | POA: Diagnosis not present

## 2023-08-31 NOTE — Patient Instructions (Addendum)
 Medication Instructions:  No changes  If you need a refill on your cardiac medications before your next appointment, please call your pharmacy.   Lab work: No new labs needed  Testing/Procedures: Your physician has requested that you have an echocardiogram. Echocardiography is a painless test that uses sound waves to create images of your heart. It provides your doctor with information about the size and shape of your heart and how well your heart's chambers and valves are working.   You may receive an ultrasound enhancing agent through an IV if needed to better visualize your heart during the echo. This procedure takes approximately one hour.  There are no restrictions for this procedure.  This will take place at [address] [facility]) [address], [place] [address]  Please note: We ask at that you not bring children with you during ultrasound (echo/ vascular) testing. Due to room size and safety concerns, children are not allowed in the ultrasound rooms during exams. Our front office staff cannot provide observation of children in our lobby area while testing is being conducted. An adult accompanying a patient to their appointment will only be allowed in the ultrasound room at the discretion of the ultrasound technician under special circumstances. We apologize for any inconvenience.   CT coronary calcium score.   - $99 out of pocket cost at the time of your test - Call [phone] to schedule at your convenience.  [facility] [address] [place], [place] [address]   Coronary CalciumScan A coronary calcium scan is an imaging test used to look for deposits of calcium and other fatty materials (plaques) in the inner lining of the blood vessels of the heart (coronary arteries). These deposits of calcium and plaques can partly clog and narrow the coronary arteries without producing any symptoms or warning signs. This puts a  person at risk for a heart attack. This test can detect these deposits before symptoms develop. Tell a health care provider about: Any allergies you have. All medicines you are taking, including vitamins, herbs, eye drops, creams, and over-the-counter medicines. Any problems you or family members have had with anesthetic medicines. Any blood disorders you have. Any surgeries you have had. Any medical conditions you have. Whether you are pregnant or may be pregnant. What are the risks? Generally, this is a safe procedure. However, problems may occur, including: Harm to a pregnant woman and her unborn baby. This test involves the use of radiation. Radiation exposure can be dangerous to a pregnant woman and her unborn baby. If you are pregnant, you generally should not have this procedure done. Slight increase in the risk of cancer. This is because of the radiation involved in the test. What happens before the procedure? No preparation is needed for this procedure. What happens during the procedure? You will undress and remove any jewelry around your neck or chest. You will put on a hospital gown. Sticky electrodes will be placed on your chest. The electrodes will be connected to an electrocardiogram (ECG) machine to record a tracing of the electrical activity of your heart. A CT scanner will take pictures of your heart. During this time, you will be asked to lie still and hold your breath for 2-3 seconds while a picture of your heart is being taken. The procedure may vary among health care providers and hospitals. What happens after the procedure? You can get dressed. You can return to your normal activities. It is up to you  to get the results of your test. Ask your health care provider, or the department that is doing the test, when your results will be ready. Summary A coronary calcium scan is an imaging test used to look for deposits of calcium and other fatty materials (plaques) in the  inner lining of the blood vessels of the heart (coronary arteries). Generally, this is a safe procedure. Tell your health care provider if you are pregnant or may be pregnant. No preparation is needed for this procedure. A CT scanner will take pictures of your heart. You can return to your normal activities after the scan is done. This information is not intended to replace advice given to you by your health care provider. Make sure you discuss any questions you have with your health care provider. Document Released: 06/28/2007 Document Revised: 11/19/2015 Document Reviewed: 11/19/2015 Elsevier Interactive Patient Education  2017 [employer].   Follow-Up: At [facility], you and your health needs are our priority.  As part of our continuing mission to provide you with exceptional heart care, we have created designated Provider Care Teams.  These Care Teams include your primary Cardiologist (physician) and Advanced Practice Providers (APPs -  Physician Assistants and Nurse Practitioners) who all work together to provide you with the care you need, when you need it.  You will need a follow up appointment as needed  Providers on your designated Care Team:   Nicolasa Ducking, NP Eula Listen, PA-C Cadence Fransico Michael, [place]  COVID-19 Vaccine Information can be found at: [email or web address] For questions related to vaccine distribution or appointments, please email [email or web address] or call 203 139 1604.

## 2023-09-01 ENCOUNTER — Ambulatory Visit: Admitting: Orthopedic Surgery

## 2023-09-01 ENCOUNTER — Encounter: Payer: Self-pay | Admitting: Orthopedic Surgery

## 2023-09-01 DIAGNOSIS — M79641 Pain in right hand: Secondary | ICD-10-CM

## 2023-09-01 DIAGNOSIS — M65332 Trigger finger, left middle finger: Secondary | ICD-10-CM

## 2023-09-07 DIAGNOSIS — M65332 Trigger finger, left middle finger: Secondary | ICD-10-CM | POA: Insufficient documentation

## 2023-09-07 NOTE — Progress Notes (Unsigned)
 Office Visit Note   Patient: Tracey Watkins           Date of Birth: 15-May-1970           MRN: [id number] Visit Date: 09/01/2023              Requested by: Gareth Mliss FALCON, FNP [address] [place],  [place] [address] PCP: Gareth Mliss FALCON, FNP   Assessment & Plan: Visit Diagnoses:  1. Bilateral hand pain     Plan: Patient will undergo upper extremity NCV EMGs to determine areas of compression causing the pain and numbness in left greater than right hand.  She will follow-up with Dr. Agarwala following this test to create a treatment plan for both the carpal tunnel symptoms as well as the trigger finger she will also try more consistent use of over-the-counter anti-inflammatories and is instructed in how to dose either over-the-counter Naprosyn or ibuprofen.  Follow-Up Instructions: No follow-ups on file.   Orders:  Orders Placed This Encounter  Procedures   Ambulatory referral to Physical Medicine Rehab   No orders of the defined types were placed in this encounter.     Procedures: No procedures performed   Clinical Data: No additional findings.   Subjective: Chief Complaint  Patient presents with   Hand Pain    CTS symptoms x few years, worse in fall/winter, not activity related.  Wears store bought splint prn.  Tylenol  for pain.  Left middle trigger finger, x 2 months, locks, very painful.     HPI  Review of Systems   Objective: Vital Signs: There were no vitals taken for this visit.  Physical Exam  Ortho Exam examination of patient's hands reveals no gross abnormality or deformity.  She has full range of motion about both wrist and in all digits with the exception of her left middle which does trigger rather significantly.  There is a palpable nodule in the flexor tendon.  She has difficulty bringing this digit all the way to full extension because of discomfort.  Patient has a positive Phalen exam on the left as well as positive Tinel's at  the wrist of the left hand, minimally so on the right.  Otherwise neurovascularly intact to light touch and motor.  Full range of motion about the elbows and shoulders.  Full range of motion about the C-spine without any obvious reproduction or increase in symptoms to the hands.  X-rays show no obvious bony abnormality  Specialty Comments:  No specialty comments available.  Imaging: No results found.   PMFS History: Patient Active Problem List   Diagnosis Date Noted   Chest pain of uncertain etiology 08/29/2023   Polyp of ascending colon 11/04/2022   History of colonic polyps    Adenoma determined by colorectal biopsy 11/18/2016   Colon cancer screening    Benign neoplasm of cecum    Screen for colon cancer    S/P laparoscopic cholecystectomy 02/29/2016   Allergic reaction 02/29/2016   Carpal tunnel syndrome of left wrist 10/03/2014   Past Medical History:  Diagnosis Date   Carpal tunnel syndrome    Left Wrist   Cholecystitis    Requiring Lap Chole (02/28/16)   Endometriosis    GERD (gastroesophageal reflux disease)     Family History  Problem Relation Age of Onset   ALS Mother    Dementia Mother    Heart disease Father    Hypertension Father    Diabetes Father    Gout  Father    Heart disease Sister    Other Brother    Cancer Brother    Breast cancer Paternal Grandmother 54    Past Surgical History:  Procedure Laterality Date   CHOLECYSTECTOMY N/A 02/27/2016   Procedure: LAPAROSCOPIC CHOLECYSTECTOMY;  Surgeon: Laneta JULIANNA Luna, MD;  Location: [facility];  Service: General;  Laterality: N/A;   COLONOSCOPY WITH PROPOFOL  N/A 09/16/2016   Procedure: COLONOSCOPY WITH PROPOFOL ;  Surgeon: Jinny Carmine, MD;  Location: [facility] END[facility]COPY;  Service: Endoscopy;  Laterality: N/A;   COLONOSCOPY WITH PROPOFOL  N/A 10/07/2016   Procedure: COLONOSCOPY WITH PROPOFOL ;  Surgeon: Jinny Carmine, MD;  Location: [facility] END[facility];  Service: Endoscopy;  Laterality: N/A;   COLONOSCOPY WITH PROPOFOL  N/A  09/29/2017   Procedure: COLONOSCOPY WITH PROPOFOL ;  Surgeon: Jinny Carmine, MD;  Location: [facility];  Service: Endoscopy;  Laterality: N/A;   COLONOSCOPY WITH PROPOFOL  N/A 11/04/2022   Procedure: COLONOSCOPY WITH PROPOFOL ;  Surgeon: Jinny Carmine, MD;  Location: [facility];  Service: Endoscopy;  Laterality: N/A;   DIAGNOSTIC LAPAROSCOPY     PLANTAR FASCIA RELEASE Left 10/08/2017   Procedure: PLANTAR FASCIA RELEASE - WITH TOPAZ;  Surgeon: Lilli Cough, DPM;  Location: [facility];  Service: Podiatry;  Laterality: Left;  [facility]AZ ENDOSCOPIC PLANTAR FASC SET LMA WITH EXPRELL   POLYPECTOMY  11/04/2022   Procedure: POLYPECTOMY;  Surgeon: Jinny Carmine, MD;  Location: [facility] ENDOSCOPY;  Service: Endoscopy;;   TUBAL LIGATION     Social History   Occupational History   Not on file  Tobacco Use   Smoking status: Never   Smokeless tobacco: Never  Vaping Use   Vaping status: Never Used  Substance and Sexual Activity   Alcohol use: No   Drug use: No   Sexual activity: Yes    Birth control/protection: Pill

## 2023-09-10 ENCOUNTER — Ambulatory Visit
Admission: RE | Admit: 2023-09-10 | Discharge: 2023-09-10 | Disposition: A | Discharge status: Home / Self Care | Payer: Self-pay | Source: Ambulatory Visit | Attending: Cardiovascular Disease | Admitting: Cardiovascular Disease

## 2023-09-10 DIAGNOSIS — R079 Chest pain, unspecified: Secondary | ICD-10-CM | POA: Insufficient documentation

## 2023-09-14 ENCOUNTER — Ambulatory Visit: Payer: Self-pay | Admitting: Cardiovascular Disease

## 2023-09-15 NOTE — Progress Notes (Unsigned)
 PCP: Gareth Mliss FALCON, FNP   No chief complaint on file.   HPI:      [name] is a 53 y.o. G3P3 whose LMP was No LMP recorded. Patient is perimenopausal., presents today for her annual examination.  Her menses are absent on aygestin . Started for endometriosis. No BTB/dysmen. Hasn't tried going off POPs. Does have some vasomotor sx.   Sex activity: single partner, contraception - POPs.  She does not have vaginal dryness.  Last Pap: 09/12/22 and 08/03/20 Results were: ASCUS with NEGATIVE high risk HPV, repeat due in 1 yr  Last mammogram: 10/17/21 Results were: normal--routine follow-up in 12 months There is a FH of breast cancer in her PGM, genetic testing not indicated. There is no FH of ovarian cancer. The patient does do self-breast exams.  Colonoscopy: 10/24 with polyp at [facility]; repeat due after 1 yr; 2019 with Dr. Jinny with hx of polyps with repeat due after 5 years.   Tobacco use: The patient denies current or previous tobacco use. Alcohol use: none No drug use Exercise: moderately active  She does get adequate calcium but not Vitamin D  in her diet. Hx of Vit D deficiency.  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    Past Surgical History:  Procedure Laterality Date   CHOLECYSTECTOMY N/A 02/27/2016   Procedure: LAPAROSCOPIC CHOLECYSTECTOMY;  Surgeon: Laneta FALCON Luna, MD;  Location: [facility];  Service: General;  Laterality: N/A;   COLONOSCOPY WITH PROPOFOL  N/A 09/16/2016   Procedure: COLONOSCOPY WITH PROPOFOL ;   Surgeon: Jinny Carmine, MD;  Location: [facility];  Service: Endoscopy;  Laterality: N/A;   COLONOSCOPY WITH PROPOFOL  N/A 10/07/2016   Procedure: COLONOSCOPY WITH PROPOFOL ;  Surgeon: Jinny Carmine, MD;  Location: [facility] END[facility]COPY;  Service: Endoscopy;  Laterality: N/A;   COLONOSCOPY WITH PROPOFOL  N/A 09/29/2017   Procedure: COLONOSCOPY WITH PROPOFOL ;  Surgeon: Jinny Carmine, MD;  Location: [facility] END[facility]COPY;  Service: Endoscopy;  Laterality: N/A;   COLONOSCOPY WITH PROPOFOL  N/A 11/04/2022   Procedure: COLONOSCOPY WITH PROPOFOL ;  Surgeon: Jinny Carmine, MD;  Location: [facility];  Service: Endoscopy;  Laterality: N/A;   DIAGNOSTIC LAPAROSCOPY     PLANTAR FASCIA RELEASE Left 10/08/2017   Procedure: PLANTAR FASCIA RELEASE - WITH TOPAZ;  Surgeon: Lilli Cough, DPM;  Location: [facility];  Service: Podiatry;  Laterality: Left;  TOPAZ ENDOSCOPIC PLANTAR FASC SET LMA WITH EXPRELL   POLYPECTOMY  11/04/2022   Procedure: POLYPECTOMY;  Surgeon: Jinny Carmine, MD;  Location: [facility] END[facility];  Service: Endoscopy;;   TUBAL LIGATION      Family History  Problem Relation Age of Onset   ALS Mother    Dementia Mother    Heart disease Father    Hypertension Father    Diabetes Father    Gout Father    Heart disease Sister    Other Brother    Cancer Brother    Breast cancer Paternal Grandmother 10    Social History   Socioeconomic History   Marital status:  Married    Spouse name: Not on file   Number of children: Not on file   Years of education: Not on file   Highest education level: 12th grade  Occupational History   Not on file  Tobacco Use   Smoking status: Never   Smokeless tobacco: Never  Vaping Use   Vaping status: Never Used  Substance and Sexual Activity   Alcohol use: No   Drug use: No   Sexual activity: Yes    Birth control/protection: Pill  Other Topics Concern   Not on file  Social History Narrative   Not on file   Social Drivers of Health   Financial Resource  Strain: Low Risk  (12/18/2022)   Overall Financial Resource Strain (CARDIA)    Difficulty of Paying Living Expenses: Not hard at all  Food Insecurity: No Food Insecurity (12/18/2022)   Hunger Vital Sign    Worried About Running Out of Food in the Last Year: Never true    Ran Out of Food in the Last Year: Never true  Transportation Needs: No Transportation Needs (12/18/2022)   PRAPARE - Administrator, Civil Service (Medical): No    Lack of Transportation (Non-Medical): No  Physical Activity: Sufficiently Active (12/18/2022)   Exercise Vital Sign    Days of Exercise per Week: 3 days    Minutes of Exercise per Session: 150+ min  Recent Concern: Physical Activity - Insufficiently Active (12/16/2022)   Exercise Vital Sign    Days of Exercise per Week: 3 days    Minutes of Exercise per Session: 20 min  Stress: No Stress Concern Present (12/18/2022)   [employer] of Occupational Health - Occupational Stress Questionnaire    Feeling of Stress : Only a little  Recent Concern: Stress - Stress Concern Present (12/16/2022)   [employer] of Occupational Health - Occupational Stress Questionnaire    Feeling of Stress : To some extent  Social Connections: Moderately Isolated (12/18/2022)   Social Connection and Isolation Panel    Frequency of Communication with Friends and Family: More than three times a week    Frequency of Social Gatherings with Friends and Family: Three times a week    Attends Religious Services: Never    Active Member of Clubs or Organizations: No    Attends Banker Meetings: Never    Marital Status: Married  Catering manager Violence: Not At Risk (12/18/2022)   Humiliation, Afraid, Rape, and Kick questionnaire    Fear of Current or Ex-Partner: No    Emotionally Abused: No    Physically Abused: No    Sexually Abused: No     Current Outpatient Medications:    albuterol  (VENTOLIN  HFA) 108 (90 Base) MCG/ACT inhaler, Inhale 1-2 puffs into the  lungs every 4 (four) hours as needed for wheezing or shortness of breath., Disp: 8 g, Rfl: 3   cholecalciferol (VITAMIN D3) 25 MCG (1000 UNIT) tablet, Take 1,000 Units by mouth daily., Disp: , Rfl:    pantoprazole  (PROTONIX ) 40 MG tablet, Take 1 tablet (40 mg total) by mouth daily for 28 days. (Patient not taking: Reported on 08/31/2023), Disp: 28 tablet, Rfl: 0   ranitidine (ZANTAC) 150 MG tablet, Take 150 mg by mouth 2 (two) times daily., Disp: , Rfl:      ROS:  Review of Systems  Constitutional:  Negative for fatigue, fever and unexpected weight change.  Respiratory:  Negative for cough, shortness of breath and wheezing.   Cardiovascular:  Negative for  chest pain, palpitations and leg swelling.  Gastrointestinal:  Negative for blood in stool, constipation, diarrhea, nausea and vomiting.  Endocrine: Negative for cold intolerance, heat intolerance and polyuria.  Genitourinary:  Negative for dyspareunia, dysuria, flank pain, frequency, genital sores, hematuria, menstrual problem, pelvic pain, urgency, vaginal bleeding, vaginal discharge and vaginal pain.  Musculoskeletal:  Negative for back pain, joint swelling and myalgias.  Skin:  Negative for rash.  Neurological:  Negative for dizziness, syncope, light-headedness, numbness and headaches.  Hematological:  Negative for adenopathy.  Psychiatric/Behavioral:  Negative for agitation, confusion, sleep disturbance and suicidal ideas. The patient is not nervous/anxious.    BREAST: No symptoms    Objective: There were no vitals taken for this visit.   Physical Exam Constitutional:      Appearance: She is well-developed.  Genitourinary:     Vulva normal.     Right Labia: No rash, tenderness or lesions.    Left Labia: No tenderness, lesions or rash.    No vaginal discharge, erythema or tenderness.      Right Adnexa: not tender and no mass present.    Left Adnexa: not tender and no mass present.    No cervical friability or polyp.      Uterus is not enlarged or tender.  Breasts:    Right: No mass, nipple discharge, skin change or tenderness.     Left: No mass, nipple discharge, skin change or tenderness.  Neck:     Thyroid : No thyromegaly.  Cardiovascular:     Rate and Rhythm: Normal rate and regular rhythm.     Heart sounds: Normal heart sounds. No murmur heard. Pulmonary:     Effort: Pulmonary effort is normal.     Breath sounds: Normal breath sounds.  Abdominal:     Palpations: Abdomen is soft.     Tenderness: There is no abdominal tenderness. There is no guarding or rebound.  Musculoskeletal:        General: Normal range of motion.     Cervical back: Normal range of motion.  Lymphadenopathy:     Cervical: No cervical adenopathy.  Neurological:     General: No focal deficit present.     Mental Status: She is alert and oriented to person, place, and time.     Cranial Nerves: No cranial nerve deficit.  Skin:    General: Skin is warm and dry.  Psychiatric:        Mood and Affect: Mood normal.        Behavior: Behavior normal.        Thought Content: Thought content normal.        Judgment: Judgment normal.  Vitals reviewed.    Assessment/Plan:  Encounter for annual routine gynecological examination  Cervical cancer screening - Plan: Cytology - PAP  Screening for HPV (human papillomavirus) - Plan: Cytology - PAP  ASCUS of cervix with negative high risk HPV - Plan: Cytology - PAP; repeat pap today. Will f/u if abn.   Encounter for surveillance of contraceptive pills--will stop POPs for now due to age. Pt to f/u if bleeding/pain resumes for POP restart.   Encounter for screening mammogram for malignant neoplasm of breast - Plan: [facility] 3D SCREENING MAMMOGRAM BILATERAL BREAST; pt to schedule mammo  Screening for colon cancer--pt will call to schedule. Will send ref if needed.   LABS NEXT YR         GYN counsel breast self exam, mammography screening, menopause, adequate intake of calcium and vitamin D ,  diet and  exercise    F/U  No follow-ups on file.  Suriya Kovarik B. Yerachmiel Spinney, PA-C 09/15/2023 4:55 PM

## 2023-09-16 ENCOUNTER — Ambulatory Visit (INDEPENDENT_AMBULATORY_CARE_PROVIDER_SITE_OTHER): Admitting: Obstetrics and Gynecology

## 2023-09-16 ENCOUNTER — Other Ambulatory Visit (HOSPITAL_COMMUNITY)
Admission: RE | Admit: 2023-09-16 | Discharge: 2023-09-16 | Disposition: A | Discharge status: Home / Self Care | Source: Ambulatory Visit | Attending: Obstetrics and Gynecology | Admitting: Obstetrics and Gynecology

## 2023-09-16 ENCOUNTER — Encounter: Payer: Self-pay | Admitting: Obstetrics and Gynecology

## 2023-09-16 VITALS — BP 86/54 | HR 57 | Ht 67.0 in | Wt 192.0 lb

## 2023-09-16 DIAGNOSIS — Z1151 Encounter for screening for human papillomavirus (HPV): Secondary | ICD-10-CM | POA: Diagnosis not present

## 2023-09-16 DIAGNOSIS — R87610 Atypical squamous cells of undetermined significance on cytologic smear of cervix (ASC-US): Secondary | ICD-10-CM | POA: Diagnosis not present

## 2023-09-16 DIAGNOSIS — Z01411 Encounter for gynecological examination (general) (routine) with abnormal findings: Secondary | ICD-10-CM | POA: Diagnosis not present

## 2023-09-16 DIAGNOSIS — N898 Other specified noninflammatory disorders of vagina: Secondary | ICD-10-CM | POA: Diagnosis not present

## 2023-09-16 DIAGNOSIS — Z124 Encounter for screening for malignant neoplasm of cervix: Secondary | ICD-10-CM | POA: Diagnosis not present

## 2023-09-16 DIAGNOSIS — Z1231 Encounter for screening mammogram for malignant neoplasm of breast: Secondary | ICD-10-CM

## 2023-09-16 DIAGNOSIS — Z1211 Encounter for screening for malignant neoplasm of colon: Secondary | ICD-10-CM

## 2023-09-16 DIAGNOSIS — Z01419 Encounter for gynecological examination (general) (routine) without abnormal findings: Secondary | ICD-10-CM

## 2023-09-16 NOTE — Patient Instructions (Addendum)
 I value your feedback and you entrusting [place] with your care. If you get a [facility] patient survey, I would appreciate you taking the time to let us know about your experience today. Thank you!  [facility] ([place])--[phone]

## 2023-09-18 ENCOUNTER — Ambulatory Visit: Admitting: Physical Medicine and Rehabilitation

## 2023-09-18 DIAGNOSIS — R29898 Other symptoms and signs involving the musculoskeletal system: Secondary | ICD-10-CM | POA: Diagnosis not present

## 2023-09-18 DIAGNOSIS — R202 Paresthesia of skin: Secondary | ICD-10-CM

## 2023-09-18 DIAGNOSIS — M79641 Pain in right hand: Secondary | ICD-10-CM | POA: Diagnosis not present

## 2023-09-18 DIAGNOSIS — M79642 Pain in left hand: Secondary | ICD-10-CM | POA: Diagnosis not present

## 2023-09-18 LAB — CYTOLOGY - PAP
Comment: NEGATIVE
Diagnosis: NEGATIVE
Diagnosis: REACTIVE
High risk HPV: NEGATIVE

## 2023-09-18 NOTE — Progress Notes (Signed)
 Tracey Watkins - 53 y.o. female MRN [id number]  Date of birth: 03-21-1970  Office Visit Note: Visit Date: 09/18/2023 PCP: Tracey Mliss FALCON, FNP Referred by: Tracey Watkins  Subjective: Chief Complaint  Patient presents with   Left Hand - Pain, Numbness, Weakness   Right Hand - Pain, Numbness, Weakness   HPI: Tracey Watkins is a 53 y.o. female who comes in today at the request of Tracey Henry, PA-C for evaluation and management of chronic, worsening and severe pain, numbness and tingling in the Bilateral upper extremities.  Patient is Right hand dominant.  She reports several years of bilateral hand complaints with numbness and tingling and does feel weakness with dropping objects and opening things.  She has tried wrist splints and anti-inflammatories over the years without much relief.  She has not had prior electrodiagnostic studies.  She is also complaining of symptoms of left middle finger trigger finger.  In terms of the numbness and tingling it is somewhat nondermatomal and global.  She does not seem to have any radicular symptoms down the arms.  Some history of neck pain at times.  No prior cervical surgery.  She is not diabetic and does not have a history of polyneuropathy.  She does not endorse really left or right being worse.  This has worsened overall in the last several months.   I spent more than 30 minutes speaking face-to-face with the patient with 50% of the time in counseling and discussing coordination of care.       Review of Systems  Musculoskeletal:  Positive for joint pain.  Neurological:  Positive for tingling and focal weakness.  All other systems reviewed and are negative.  Otherwise per HPI.  Assessment & Plan: Visit Diagnoses:    ICD-10-CM   1. Paresthesia of skin  R20.2 NCV with EMG (electromyography)    2. Bilateral hand pain  M79.641    M79.642     3. Weakness of both hands  R29.898        Plan: Impression: Clinically could have an underlying  carpal tunnel syndrome although does not seem to be very severe.  Likely has some musculotendinous issues with the hands.  Electrodiagnostic study performed today the above electrodiagnostic study is ABNORMAL and reveals evidence of a mild left median nerve entrapment at the wrist (carpal tunnel syndrome) affecting sensory components.   There is no significant electrodiagnostic evidence of any other focal nerve entrapment, brachial plexopathy or cervical radiculopathy.  **This electrodiagnostic study cannot rule out small fiber polyneuropathy and dysesthesias from central pain syndromes such as stroke or central pain sensitization syndromes such as fibromyalgia.  Myotomal referral pain from trigger points is also not excluded.  Recommendations: 1.  Follow-up with referring physician. 2.  Continue current management of symptoms. 3.  Continue use of resting splint at night-time and as needed during the day.  Meds & Orders: No orders of the defined types were placed in this encounter.   Orders Placed This Encounter  Procedures   NCV with EMG (electromyography)    Follow-up: Return for Tracey Henry, PA-C.   Procedures: No procedures performed  EMG & NCV Findings: Evaluation of the left median (across palm) sensory nerve showed prolonged distal peak latency (Wrist, 3.9 ms).  All remaining nerves (as indicated in the following tables) were within normal limits.  Left vs. Right side comparison data for the ulnar sensory nerve indicates abnormal L-R latency difference (0.6 ms).  All remaining left vs. right  side differences were within normal limits.    All examined muscles (as indicated in the following table) showed no evidence of electrical instability.    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   There is no significant electrodiagnostic evidence of  any other focal nerve entrapment, brachial plexopathy or cervical radiculopathy.  **This electrodiagnostic study cannot rule out small fiber polyneuropathy and dysesthesias from central pain syndromes such as stroke or central pain sensitization syndromes such as fibromyalgia.  Myotomal referral pain from trigger points is also not excluded.  Recommendations: 1.  Follow-up with referring physician. 2.  Continue current management of symptoms. 3.  Continue use of resting splint at night-time and as needed during the day.  ___________________________ Tracey Watkins Board Certified, American Board of Physical Medicine and Rehabilitation    Nerve Conduction Studies Anti Sensory Summary Table   Stim Site NR Peak (ms) Norm Peak (ms) P-T Amp (V) Norm P-T Amp Site1 Site2 Delta-P (ms) Dist (cm) Vel (m/s) Norm Vel (m/s)  Left Median Acr Palm Anti Sensory (2nd Digit)  30.4C  Wrist    *3.9 <3.6 25.4 >10 Wrist Palm 2.0 0.0    Palm    1.9 <2.0 25.1         Right Median Acr Palm Anti Sensory (2nd Digit)  30.7C  Wrist    3.6 <3.6 28.8 >10 Wrist Palm 1.9 0.0    Palm    1.7 <2.0 12.5         Left Radial Anti Sensory (Base 1st Digit)  30.3C  Wrist    2.2 <3.1 35.9  Wrist Base 1st Digit 2.2 0.0    Right Radial Anti Sensory (Base 1st Digit)  30.9C  Wrist    2.1 <3.1 49.0  Wrist Base 1st Digit 2.1 0.0    Left Ulnar Anti Sensory (5th Digit)  30.6C  Wrist    3.6 <3.7 22.0 >15.0 Wrist 5th Digit 3.6 14.0 39 >38  Right Ulnar Anti Sensory (5th Digit)  31.4C  Wrist    3.0 <3.7 19.4 >15.0 Wrist 5th Digit 3.0 14.0 47 >38   Motor Summary Table   Stim Site NR Onset (ms) Norm Onset (ms) O-P Amp (mV) Norm O-P Amp Site1 Site2 Delta-0 (ms) Dist (cm) Vel (m/s) Norm Vel (m/s)  Left Median Motor (Abd Poll Brev)  30.3C  Wrist    3.6 <4.2 8.8 >5 Elbow Wrist 3.8 20.1 53 >50  Elbow    7.4  8.7         Right Median Motor (Abd Poll Brev)  31.1C  Wrist    3.4 <4.2 7.6 >5 Elbow Wrist 3.7 20.5 55 >50  Elbow     7.1  7.5         Left Ulnar Motor (Abd Dig Min)  30.3C  Wrist    2.7 <4.2 9.7 >3 B Elbow Wrist 3.4 19.0 56 >53  B Elbow    6.1  10.0  A Elbow B Elbow 1.4 10.0 71 >53  A Elbow    7.5  10.1         Right Ulnar Motor (Abd Dig Min)  31.1C  Wrist    2.5 <4.2 9.0 >3 B Elbow Wrist 3.5 20.0 57 >53  B Elbow    6.0  9.0  A Elbow B Elbow 1.6 10.0 63 >53  A Elbow    7.6  9.0  EMG   Side Muscle Nerve Root Ins Act Fibs Psw Amp Dur Poly Recrt Int Bruna Comment  Left Abd Poll Brev Median C8-T1 Nml Nml Nml Nml Nml 0 Nml Nml   Left 1stDorInt Ulnar C8-T1 Nml Nml Nml Nml Nml 0 Nml Nml   Left PronatorTeres Median C6-7 Nml Nml Nml Nml Nml 0 Nml Nml   Left Biceps Musculocut C5-6 Nml Nml Nml Nml Nml 0 Nml Nml   Left Deltoid Axillary C5-6 Nml Nml Nml Nml Nml 0 Nml Nml     Nerve Conduction Studies Anti Sensory Left/Right Comparison   Stim Site L Lat (ms) R Lat (ms) L-R Lat (ms) L Amp (V) R Amp (V) L-R Amp (%) Site1 Site2 L Vel (m/s) R Vel (m/s) L-R Vel (m/s)  Median Acr Palm Anti Sensory (2nd Digit)  30.4C  Wrist *3.9 3.6 0.3 25.4 28.8 11.8 Wrist Palm     Palm 1.9 1.7 0.2 25.1 12.5 50.2       Radial Anti Sensory (Base 1st Digit)  30.3C  Wrist 2.2 2.1 0.1 35.9 49.0 26.7 Wrist Base 1st Digit     Ulnar Anti Sensory (5th Digit)  30.6C  Wrist 3.6 3.0 *0.6 22.0 19.4 11.8 Wrist 5th Digit 39 47 8   Motor Left/Right Comparison   Stim Site L Lat (ms) R Lat (ms) L-R Lat (ms) L Amp (mV) R Amp (mV) L-R Amp (%) Site1 Site2 L Vel (m/s) R Vel (m/s) L-R Vel (m/s)  Median Motor (Abd Poll Brev)  30.3C  Wrist 3.6 3.4 0.2 8.8 7.6 13.6 Elbow Wrist 53 55 2  Elbow 7.4 7.1 0.3 8.7 7.5 13.8       Ulnar Motor (Abd Dig Min)  30.3C  Wrist 2.7 2.5 0.2 9.7 9.0 7.2 B Elbow Wrist 56 57 1  B Elbow 6.1 6.0 0.1 10.0 9.0 10.0 A Elbow B Elbow 71 63 8  A Elbow 7.5 7.6 0.1 10.1 9.0 10.9          Waveforms:                     Clinical History: No specialty comments available.   She reports that she has never  smoked. She has never used smokeless tobacco.  Recent Labs    12/18/22 1039  HGBA1C 5.5    Objective:  VS:  HT:    WT:   BMI:     BP:   HR: bpm  TEMP: ( )  RESP:  Physical Exam Vitals and nursing note reviewed.  Constitutional:      General: She is not in acute distress.    Appearance: Normal appearance. She is well-developed. She is not ill-appearing.  HENT:     Head: Normocephalic and atraumatic.  Eyes:     Conjunctiva/sclera: Conjunctivae normal.     Pupils: Pupils are equal, round, and reactive to light.  Cardiovascular:     Rate and Rhythm: Normal rate.     Pulses: Normal pulses.  Pulmonary:     Effort: Pulmonary effort is normal.  Musculoskeletal:        General: Tenderness present. No swelling or deformity.     Right lower leg: No edema.     Left lower leg: No edema.     Comments: Inspection reveals no atrophy of the bilateral APB or FDI or hand intrinsics. There is no swelling, color changes, allodynia or dystrophic changes. There is 5 out of 5 strength in the bilateral wrist extension, finger abduction and  long finger flexion. There is intact sensation to light touch in all dermatomal and peripheral nerve distributions. There is a negative Froment's test bilaterally. There is a negative Tinel's test at the bilateral wrist and elbow. There is a negative Phalen's test bilaterally. There is a negative Hoffmann's test bilaterally.  Skin:    General: Skin is warm and dry.     Findings: No erythema or rash.  Neurological:     General: No focal deficit present.     Mental Status: She is alert and oriented to person, place, and time.     Cranial Nerves: No cranial nerve deficit.     Sensory: No sensory deficit.     Motor: No weakness or abnormal muscle tone.     Coordination: Coordination normal.     Gait: Gait normal.  Psychiatric:        Mood and Affect: Mood normal.        Behavior: Behavior normal.     Ortho Exam  Imaging: No results found.  Past  Medical/Family/Surgical/Social History: Medications & Allergies reviewed per EMR, new medications updated. 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   Past Medical History:  Diagnosis Date   Carpal tunnel syndrome    Left Wrist   Cholecystitis    Requiring Lap Chole (02/28/16)   Endometriosis    GERD (gastroesophageal reflux disease)    Family History  Problem Relation Age of Onset   ALS Mother    Dementia Mother    Heart disease Father    Hypertension Father    Diabetes Father    Gout Father    Heart disease Sister    Non-Hodgkin's lymphoma Brother    Other Brother        Brain Tumor   Breast cancer Paternal Grandmother 6   Past Surgical History:  Procedure Laterality Date   CHOLECYSTECTOMY N/A 02/27/2016   Procedure: LAPAROSCOPIC CHOLECYSTECTOMY;  Surgeon: Laneta JULIANNA Luna, MD;  Location: [facility];  Service: General;  Laterality: N/A;   COLONOSCOPY WITH PROPOFOL  N/A 09/16/2016   Procedure: COLONOSCOPY WITH PROPOFOL ;  Surgeon: Jinny Carmine, MD;  Location: [facility];  Service: Endoscopy;  Laterality: N/A;   COLONOSCOPY WITH PROPOFOL  N/A 10/07/2016   Procedure: COLONOSCOPY WITH PROPOFOL ;  Surgeon: Jinny Carmine, MD;  Location: [facility] END[facility];  Service: Endoscopy;  Laterality: N/A;   COLONOSCOPY WITH PROPOFOL  N/A 09/29/2017   Procedure: COLONOSCOPY WITH PROPOFOL ;  Surgeon: Jinny Carmine, MD;  Location: [facility] END[facility];  Service: Endoscopy;  Laterality: N/A;   COLONOSCOPY WITH PROPOFOL  N/A 11/04/2022   Procedure: COLONOSCOPY WITH PROPOFOL ;  Surgeon:  Jinny Carmine, MD;  Location: [facility] END[facility];  Service: Endoscopy;  Laterality: N/A;   DIAGNOSTIC LAPAROSCOPY     PLANTAR FASCIA RELEASE Left 10/08/2017   Procedure: PLANTAR FASCIA RELEASE - WITH TOPAZ;  Surgeon: Lilli Cough, DPM;  Location: [facility];  Service: Podiatry;  Laterality: Left;  [facility] ENDOSCOPIC PLANTAR FASC SET LMA WITH EXPRELL   POLYPECTOMY  11/04/2022   Procedure: POLYPECTOMY;  Surgeon: Jinny Carmine, MD;  Location: [facility]  END[facility]COPY;  Service: Endoscopy;;   TUBAL LIGATION     Social History   Occupational History   Not on file  Tobacco Use   Smoking status: Never   Smokeless tobacco: Never  Vaping Use   Vaping status: Never Used  Substance and Sexual Activity   Alcohol use: No   Drug use: No   Sexual activity: Not Currently    Birth control/protection: Post-menopausal

## 2023-09-18 NOTE — Progress Notes (Signed)
 Pain Scale   Average Pain 5 Patient advising she has bilateral hand pain, numbness and tingling also weakness in hands. Patient advising she is Right Hand Dominate.        +Driver, -BT, -Dye Allergies.

## 2023-09-21 ENCOUNTER — Encounter: Payer: Self-pay | Admitting: Emergency Medicine

## 2023-09-21 ENCOUNTER — Ambulatory Visit: Payer: Self-pay | Admitting: Obstetrics and Gynecology

## 2023-09-23 DIAGNOSIS — Z1211 Encounter for screening for malignant neoplasm of colon: Secondary | ICD-10-CM

## 2023-09-24 ENCOUNTER — Encounter: Payer: Self-pay | Admitting: Physical Medicine and Rehabilitation

## 2023-09-24 NOTE — Procedures (Signed)
 EMG & NCV Findings: Evaluation of the left median (across palm) sensory nerve showed prolonged distal peak latency (Wrist, 3.9 ms).  All remaining nerves (as indicated in the following tables) were within normal limits.  Left vs. Right side comparison data for the ulnar sensory nerve indicates abnormal L-R latency difference (0.6 ms).  All remaining left vs. right side differences were within normal limits.    All examined muscles (as indicated in the following table) showed no evidence of electrical instability.    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   There is no significant electrodiagnostic evidence of any other focal nerve entrapment, brachial plexopathy or cervical radiculopathy.  **This electrodiagnostic study cannot rule out small fiber polyneuropathy and dysesthesias from central pain syndromes such as stroke or central pain sensitization syndromes such as fibromyalgia.  Myotomal referral pain from trigger points is also not excluded.  Recommendations: 1.  Follow-up with referring physician. 2.  Continue current management of symptoms. 3.  Continue use of resting splint at night-time and as needed during the day.  ___________________________ Prentice Eldonna BETTERS Board Certified, American Board of Physical Medicine and Rehabilitation    Nerve Conduction Studies Anti Sensory Summary Table   Stim Site NR Peak (ms) Norm Peak (ms) P-T Amp (V) Norm P-T Amp Site1 Site2 Delta-P (ms) Dist (cm) Vel (m/s) Norm Vel (m/s)  Left Median Acr Palm Anti Sensory (2nd Digit)  30.4C  Wrist    *3.9 <3.6 25.4 >10 Wrist Palm 2.0 0.0    Palm    1.9 <2.0 25.1         Right Median Acr Palm Anti Sensory (2nd Digit)  30.7C  Wrist    3.6 <3.6 28.8 >10 Wrist Palm 1.9 0.0    Palm    1.7 <2.0 12.5         Left Radial Anti Sensory (Base 1st Digit)  30.3C   Wrist    2.2 <3.1 35.9  Wrist Base 1st Digit 2.2 0.0    Right Radial Anti Sensory (Base 1st Digit)  30.9C  Wrist    2.1 <3.1 49.0  Wrist Base 1st Digit 2.1 0.0    Left Ulnar Anti Sensory (5th Digit)  30.6C  Wrist    3.6 <3.7 22.0 >15.0 Wrist 5th Digit 3.6 14.0 39 >38  Right Ulnar Anti Sensory (5th Digit)  31.4C  Wrist    3.0 <3.7 19.4 >15.0 Wrist 5th Digit 3.0 14.0 47 >38   Motor Summary Table   Stim Site NR Onset (ms) Norm Onset (ms) O-P Amp (mV) Norm O-P Amp Site1 Site2 Delta-0 (ms) Dist (cm) Vel (m/s) Norm Vel (m/s)  Left Median Motor (Abd Poll Brev)  30.3C  Wrist    3.6 <4.2 8.8 >5 Elbow Wrist 3.8 20.1 53 >50  Elbow    7.4  8.7         Right Median Motor (Abd Poll Brev)  31.1C  Wrist    3.4 <4.2 7.6 >5 Elbow Wrist 3.7 20.5 55 >50  Elbow    7.1  7.5         Left Ulnar Motor (Abd Dig Min)  30.3C  Wrist    2.7 <4.2 9.7 >3 B Elbow Wrist 3.4 19.0 56 >53  B Elbow    6.1  10.0  A Elbow B Elbow 1.4 10.0 71 >53  A Elbow    7.5  10.1  Right Ulnar Motor (Abd Dig Min)  31.1C  Wrist    2.5 <4.2 9.0 >3 B Elbow Wrist 3.5 20.0 57 >53  B Elbow    6.0  9.0  A Elbow B Elbow 1.6 10.0 63 >53  A Elbow    7.6  9.0          EMG   Side Muscle Nerve Root Ins Act Fibs Psw Amp Dur Poly Recrt Int Bruna Comment  Left Abd Poll Brev Median C8-T1 Nml Nml Nml Nml Nml 0 Nml Nml   Left 1stDorInt Ulnar C8-T1 Nml Nml Nml Nml Nml 0 Nml Nml   Left PronatorTeres Median C6-7 Nml Nml Nml Nml Nml 0 Nml Nml   Left Biceps Musculocut C5-6 Nml Nml Nml Nml Nml 0 Nml Nml   Left Deltoid Axillary C5-6 Nml Nml Nml Nml Nml 0 Nml Nml     Nerve Conduction Studies Anti Sensory Left/Right Comparison   Stim Site L Lat (ms) R Lat (ms) L-R Lat (ms) L Amp (V) R Amp (V) L-R Amp (%) Site1 Site2 L Vel (m/s) R Vel (m/s) L-R Vel (m/s)  Median Acr Palm Anti Sensory (2nd Digit)  30.4C  Wrist *3.9 3.6 0.3 25.4 28.8 11.8 Wrist Palm     Palm 1.9 1.7 0.2 25.1 12.5 50.2       Radial Anti Sensory (Base 1st Digit)  30.3C   Wrist 2.2 2.1 0.1 35.9 49.0 26.7 Wrist Base 1st Digit     Ulnar Anti Sensory (5th Digit)  30.6C  Wrist 3.6 3.0 *0.6 22.0 19.4 11.8 Wrist 5th Digit 39 47 8   Motor Left/Right Comparison   Stim Site L Lat (ms) R Lat (ms) L-R Lat (ms) L Amp (mV) R Amp (mV) L-R Amp (%) Site1 Site2 L Vel (m/s) R Vel (m/s) L-R Vel (m/s)  Median Motor (Abd Poll Brev)  30.3C  Wrist 3.6 3.4 0.2 8.8 7.6 13.6 Elbow Wrist 53 55 2  Elbow 7.4 7.1 0.3 8.7 7.5 13.8       Ulnar Motor (Abd Dig Min)  30.3C  Wrist 2.7 2.5 0.2 9.7 9.0 7.2 B Elbow Wrist 56 57 1  B Elbow 6.1 6.0 0.1 10.0 9.0 10.0 A Elbow B Elbow 71 63 8  A Elbow 7.5 7.6 0.1 10.1 9.0 10.9          Waveforms:

## 2023-09-30 ENCOUNTER — Other Ambulatory Visit: Payer: Self-pay

## 2023-09-30 ENCOUNTER — Telehealth: Payer: Self-pay

## 2023-09-30 DIAGNOSIS — Z860100 Personal history of colon polyps, unspecified: Secondary | ICD-10-CM

## 2023-09-30 MED ORDER — PEG 3350-KCL-NA BICARB-NACL 420 G PO SOLR
4000.0000 mL | Freq: Once | ORAL | 0 refills | Status: AC
Start: 2023-09-30 — End: 2023-09-30

## 2023-09-30 NOTE — Telephone Encounter (Signed)
 Gastroenterology Pre-Procedure Review  Request Date: 12/23/23 Requesting Physician: Dr. Melany  PATIENT REVIEW QUESTIONS: The patient responded to the following health history questions as indicated:    1. Are you having any GI issues? no 2. Do you have a personal history of Polyps? yes (last colonoscopy performed last year by Dr. Jinny recommended repeat in 1 year due to polyps) 3. Do you have a family history of Colon Cancer or Polyps? no 4. Diabetes Mellitus? no 5. Joint replacements in the past 12 months?no 6. Major health problems in the past 3 months?no 7. Any artificial heart valves, MVP, or defibrillator?no 8. Cardiac history? Chest pain pt stated cardiologist found nothing but clearance still routed to CV Preop Team  for clearance.    MEDICATIONS & ALLERGIES:    Patient reports the following regarding taking any anticoagulation/antiplatelet therapy:   Plavix, Coumadin, Eliquis, Xarelto, Lovenox , Pradaxa, Brilinta, or Effient? no Aspirin? no  Patient confirms/reports the following medications:  Current Outpatient Medications  Medication Sig Dispense Refill   albuterol  (VENTOLIN  HFA) 108 (90 Base) MCG/ACT inhaler Inhale 1-2 puffs into the lungs every 4 (four) hours as needed for wheezing or shortness of breath. 8 g 3   cholecalciferol (VITAMIN D3) 25 MCG (1000 UNIT) tablet Take 1,000 Units by mouth daily.     ranitidine (ZANTAC) 150 MG tablet Take 150 mg by mouth 2 (two) times daily.     No current facility-administered medications for this visit.    Patient confirms/reports the following allergies:  Allergies  Allergen Reactions   Skin Adhesives [Cyanoacrylate] Rash    Dermabond    No orders of the defined types were placed in this encounter.   AUTHORIZATION INFORMATION Primary Insurance: 1D#: Group #:  Secondary Insurance: 1D#: Group #:  SCHEDULE INFORMATION: Date: 12/23/23 Time: Location: [facility]

## 2023-10-01 ENCOUNTER — Telehealth: Payer: Self-pay

## 2023-10-01 NOTE — Telephone Encounter (Signed)
 Patient has been scheduled for televisit and consent done     Patient Consent for Virtual Visit         Tracey Watkins has provided verbal consent on 10/01/2023 for a virtual visit (video or telephone).   CONSENT FOR VIRTUAL VISIT FOR:  Tracey Watkins  By participating in this virtual visit I agree to the following:  I hereby voluntarily request, consent and authorize [facility] and its employed or contracted physicians, physician assistants, nurse practitioners or other licensed health care professionals (the Practitioner), to provide me with telemedicine health care services (the "Services) as deemed necessary by the treating Practitioner. I acknowledge and consent to receive the Services by the Practitioner via telemedicine. I understand that the telemedicine visit will involve communicating with the Practitioner through live audiovisual communication technology and the disclosure of certain medical information by electronic transmission. I acknowledge that I have been given the opportunity to request an in-person assessment or other available alternative prior to the telemedicine visit and am voluntarily participating in the telemedicine visit.  I understand that I have the right to withhold or withdraw my consent to the use of telemedicine in the course of my care at any time, without affecting my right to future care or treatment, and that the Practitioner or I may terminate the telemedicine visit at any time. I understand that I have the right to inspect all information obtained and/or recorded in the course of the telemedicine visit and may receive copies of available information for a reasonable fee.  I understand that some of the potential risks of receiving the Services via telemedicine include:  Delay or interruption in medical evaluation due to technological equipment failure or disruption; Information transmitted may not be sufficient (e.g. poor resolution of images) to allow  for appropriate medical decision making by the Practitioner; and/or  In rare instances, security protocols could fail, causing a breach of personal health information.  Furthermore, I acknowledge that it is my responsibility to provide information about my medical history, conditions and care that is complete and accurate to the best of my ability. I acknowledge that Practitioner's advice, recommendations, and/or decision may be based on factors not within their control, such as incomplete or inaccurate data provided by me or distortions of diagnostic images or specimens that may result from electronic transmissions. I understand that the practice of medicine is not an exact science and that Practitioner makes no warranties or guarantees regarding treatment outcomes. I acknowledge that a copy of this consent can be made available to me via my patient portal [facility] MyChart), or I can request a printed copy by calling the office of [facility].    I understand that my insurance will be billed for this visit.   I have read or had this consent read to me. I understand the contents of this consent, which adequately explains the benefits and risks of the Services being provided via telemedicine.  I have been provided ample opportunity to ask questions regarding this consent and the Services and have had my questions answered to my satisfaction. I give my informed consent for the services to be provided through the use of telemedicine in my medical care

## 2023-10-01 NOTE — Telephone Encounter (Signed)
   Name: Tracey Watkins  DOB: [birth year]  MRN: [id number]  Primary Cardiologist: None  Chart reviewed as part of pre-operative protocol coverage. Because of Tracey Watkins's past medical history and time since last visit, she will require a follow-up telephone visit in order to better assess preoperative cardiovascular risk.  Pre-op covering staff: - Please schedule appointment and call patient to inform them. If patient already had an upcoming appointment within acceptable timeframe, please add pre-op clearance to the appointment notes so provider is aware. - Please contact requesting surgeon's office via preferred method (i.e, phone, fax) to inform them of need for appointment prior to surgery.  There are no medications indicated as needing held.  Tracey LOISE Fabry, PA-C  10/01/2023, 3:49 PM

## 2023-10-01 NOTE — Telephone Encounter (Signed)
 Tried contacting patient to scheduled TELEVISIT no answer left a detailed vm to call back and schedule

## 2023-10-01 NOTE — Telephone Encounter (Signed)
   Pre-operative Risk Assessment    Patient Name: Tracey Watkins  DOB: [birth year] MRN: [id number]   Date of last office visit: 08/31/23 EVALENE LUNGER, MD Date of next office visit: NONE   Request for Surgical Clearance    Procedure:  COLONOSCOPY  Date of Surgery:  Clearance 12/23/23                                Surgeon:  DR FARRIS Surgeon's Group or Practice Name:  [facility] Phone number:  [phone] Fax number:  [phone]  ATTN: MICHELLE   Type of Clearance Requested:   - Medical    Type of Anesthesia:  CHOICE   Additional requests/questions:    SignedLucie DELENA Ku   10/01/2023, 2:58 PM

## 2023-10-01 NOTE — Telephone Encounter (Signed)
 Patient has been scheduled for TELEVISIT

## 2023-10-07 ENCOUNTER — Ambulatory Visit (INDEPENDENT_AMBULATORY_CARE_PROVIDER_SITE_OTHER): Admitting: Orthopedic Surgery

## 2023-10-07 DIAGNOSIS — G5602 Carpal tunnel syndrome, left upper limb: Secondary | ICD-10-CM

## 2023-10-07 DIAGNOSIS — M65332 Trigger finger, left middle finger: Secondary | ICD-10-CM

## 2023-10-07 MED ORDER — BETAMETHASONE SOD PHOS & ACET 6 (3-3) MG/ML IJ SUSP
6.0000 mg | INTRAMUSCULAR | Status: AC | PRN
  Administered 2023-10-07: 6 mg via INTRA_ARTICULAR

## 2023-10-07 MED ORDER — LIDOCAINE HCL 1 % IJ SOLN
1.0000 mL | INTRAMUSCULAR | Status: AC | PRN
  Administered 2023-10-07: 1 mL

## 2023-10-07 NOTE — Addendum Note (Signed)
 Addended by: Merideth Bosque G on: 10/07/2023 10:05 AM   Modules accepted: Orders

## 2023-10-07 NOTE — Progress Notes (Signed)
 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

## 2023-10-07 NOTE — Progress Notes (Deleted)
 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

## 2023-10-07 NOTE — Progress Notes (Signed)
 Tracey Watkins - 53 y.o. female MRN [id number]  Date of birth: [birth year]  Office Visit Note: Visit Date: 10/07/2023 PCP: Gareth Mliss FALCON, FNP Referred by: Gareth Mliss FALCON, FNP  Subjective: No chief complaint on file.  HPI: Tracey Watkins is a pleasant 53 y.o. female who presents today for left wrist carpal tunnel syndrome. Symptoms have been present for about a year with minimal improvement. Has tried wrist bracing and aleve. She works in [employer] so she is constantly using her hands. She has had an EMG which shows mild left median nerve entrapment.  She is also describing left long finger triggering with associated clicking and locking, no prior treatments.  Pertinent ROS were reviewed with the patient and found to be negative unless otherwise specified above in HPI.   Visit Reason: Left carpal tunnel syndrome and associated left long trigger digit Duration of symptoms: 1 year  Hand dominance: right Occupation: resturaunt  Diabetic: No Smoking: No Heart/Lung History: none Blood Thinners: none Prior Testing/EMG: EMG Injections (Date): none Treatments: aleve and brace Prior Surgery: none  Assessment & Plan: Visit Diagnoses:  1. Carpal tunnel syndrome of left wrist   2. Trigger finger, left middle finger     Plan: Extensive discussion was had with the patient today about her ongoing left sided carpal tunnel syndrome that is refractory to conservative care.  Patient has both clinical and electrodiagnostic evidence to confirm this diagnosis.  We discussed treat modalities ranging from conservative to surgical.  From conservative standpoint, we discussed ongoing bracing, injections, nonsteroidal anti-inflammatory both topical and oral and possible cortisone injections for both diagnostic and therapeutic value.  We discussed the same for the left long finger trigger digit.  From a surgical standpoint, we discussed the possibility of carpal tunnel release both open endoscopic  as well as trigger digit release should symptoms remain refractory conservative care.  Risks and benefits of the procedure were discussed, risks including but not limited to infection, bleeding, scarring, stiffness, nerve injury, tendon injury, vascular injury, recurrence of symptoms and need for subsequent operation.  We also discussed the appropriate postoperative protocol and timeframe for return to activities and function.  Patient expressed understanding.    Understanding her options, she would like to move forward with cortisone injection to the left carpal tunnel and left long finger trigger digit today which is appropriate.  Carpal tunnel injection was provided under ultrasound guidance today and tolerated well.  Left long finger trigger digit injection was also provided today.  Referral will be placed occupational therapy for range of motion exercises and nerve gliding exercises.  She will follow-up with myself in approxi-6 weeks for recheck.     Follow-up: No follow-ups on file.   Meds & Orders: No orders of the defined types were placed in this encounter.  No orders of the defined types were placed in this encounter.    Procedures: Hand/UE Inj: L carpal tunnel for carpal tunnel syndrome on 10/07/2023 9:22 AM Indications: pain and diagnostic Details: 25 G needle, ultrasound-guided volar approach Medications: 1 mL lidocaine  1 %; 6 mg betamethasone  acetate-betamethasone  sodium phosphate  6 (3-3) MG/ML Outcome: tolerated well, no immediate complications  Images saved in the ultrasound-butterfly database   Hand/UE Inj: L long A1 for trigger finger on 10/07/2023 9:25 AM Indications: pain Details: 25 G needle, volar approach Medications: 1 mL lidocaine  1 %; 6 mg betamethasone  acetate-betamethasone  sodium phosphate  6 (3-3) MG/ML Outcome: tolerated well, no immediate complications Consent was given by the patient.  Patient was prepped and draped in the usual sterile fashion.           Clinical History: No specialty comments available.  She reports that she has never smoked. She has never used smokeless tobacco.  Recent Labs    12/18/22 1039  HGBA1C 5.5    Objective:   Vital Signs: LMP 05/14/2011 (Approximate)   Physical Exam  Gen: Well-appearing, in no acute distress; non-toxic CV: Regular Rate. Well-perfused. Warm.  Resp: Breathing unlabored on room air; no wheezing. Psych: Fluid speech in conversation; appropriate affect; normal thought process  Ortho Exam PHYSICAL EXAM:  General: Patient is well appearing and in no distress.   Skin and Muscle: No significant skin changes are apparent to upper extremities.   Range of Motion and Palpation Tests: Mobility is full about the elbows with flexion and extension. Forearm supination and pronation are 85/85 bilaterally.  Wrist flexion/extension is 75/65 bilaterally.  Digital flexion and extension are full.  Thumb opposition is full to the base of the small fingers bilaterally.    Left long finger palpable nodule A1 pulley region with associated tenderness.  Observable clicking and catching of the digit with deep flexion.    Neurologic, Vascular, Motor: Sensation is slightly diminished to light touch in the left median nerve distribution.    Thenar atrophy: Negative Tinel sign: Mildly positive left Carpal tunnel compression: Positive left Phalen test: Positive left   Motor left hand FPL: 5/5 Index FDP: 5/5 APB: 5/5  Fingers pink and well perfused.  Capillary refill is brisk.     Lab Results  Component Value Date   HGBA1C 5.5 12/18/2022     Imaging: No results found.  Past Medical/Family/Surgical/Social History: Medications & Allergies reviewed per EMR, new medications updated. Patient Active Problem List   Diagnosis Date Noted   Trigger finger, left middle finger 09/07/2023   Chest pain of uncertain etiology 08/29/2023   Polyp of ascending colon 11/04/2022   History of colonic polyps     Adenoma determined by colorectal biopsy 11/18/2016   Colon cancer screening    Benign neoplasm of cecum    Screen for colon cancer    S/P laparoscopic cholecystectomy 02/29/2016   Allergic reaction 02/29/2016   Carpal tunnel syndrome of left wrist 10/03/2014   Past Medical History:  Diagnosis Date   Carpal tunnel syndrome    Left Wrist   Cholecystitis    Requiring Lap Chole (02/28/16)   Endometriosis    GERD (gastroesophageal reflux disease)    Family History  Problem Relation Age of Onset   ALS Mother    Dementia Mother    Heart disease Father    Hypertension Father    Diabetes Father    Gout Father    Heart disease Sister    Non-Hodgkin's lymphoma Brother    Other Brother        Brain Tumor   Breast cancer Paternal Grandmother 33   Past Surgical History:  Procedure Laterality Date   CHOLECYSTECTOMY N/A 02/27/2016   Procedure: LAPAROSCOPIC CHOLECYSTECTOMY;  Surgeon: Laneta JULIANNA Luna, MD;  Location: [facility] ORS;  Service: General;  Laterality: N/A;   COLONOSCOPY WITH PROPOFOL  N/A 09/16/2016   Procedure: COLONOSCOPY WITH PROPOFOL ;  Surgeon: Jinny Carmine, MD;  Location: [facility] ENDOSCOPY;  Service: Endoscopy;  Laterality: N/A;   COLONOSCOPY WITH PROPOFOL  N/A 10/07/2016   Procedure: COLONOSCOPY WITH PROPOFOL ;  Surgeon: Jinny Carmine, MD;  Location: [facility] ENDOSCOPY;  Service: Endoscopy;  Laterality: N/A;   COLONOSCOPY WITH PROPOFOL  N/A 09/29/2017  Procedure: COLONOSCOPY WITH PROPOFOL ;  Surgeon: Jinny Carmine, MD;  Location[facility] END[facility]COPY;  Service: Endoscopy;  Laterality: N/A;   COLONOSCOPY WITH PROPOFOL  N/A 11/04/2022   Procedure: COLONOSCOPY WITH PROPOFOL ;  Surgeon: Jinny Carmine, MD;  Location[facility];  Service: Endoscopy;  Laterality: N/A;   DIAGNOSTIC LAPAROSCOPY     PLANTAR FASCIA RELEASE Left 10/08/2017   Procedure: PLANTAR FASCIA RELEASE - WITH TOPAZ;  Surgeon: Lilli Cough, DPM;  Location: [facility];  Service: Podiatry;  Laterality: Left;  TOPAZ ENDOSCOPIC  PLANTAR FASC SET LMA WITH EXPRELL   POLYPECTOMY  11/04/2022   Procedure: POLYPECTOMY;  Surgeon: Jinny Carmine, MD;  Location: [facility] ENDOSCOPY;  Service: Endoscopy;;   TUBAL LIGATION     Social History   Occupational History   Not on file  Tobacco Use   Smoking status: Never   Smokeless tobacco: Never  Vaping Use   Vaping status: Never Used  Substance and Sexual Activity   Alcohol use: No   Drug use: No   Sexual activity: Not Currently    Birth control/protection: Post-menopausal    Chaye Misch Afton Alderton, M.D. [facility], Hand Surgery

## 2023-10-08 NOTE — Progress Notes (Signed)
 Gareth Mliss FALCON, FNP   Chief Complaint  Patient presents with   Follow-up    Vaginal lesion    HPI:      [name] is a 53 y.o. [phone] whose LMP was Patient's last menstrual period was 05/14/2011 (approximate)., presents today for f/u  vaginal lesion found on exam 09/16/23 at [facility]. Looked like HPV and treated with TCA. Pt here for f/u. Has not noticed any sx.  Neg pap/neg HPV DNA 9/25   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    Past Surgical History:  Procedure Laterality Date   CHOLECYSTECTOMY N/A 02/27/2016   Procedure: LAPAROSCOPIC CHOLECYSTECTOMY;  Surgeon: Laneta FALCON Luna, MD;  Location: [facility];  Service: General;  Laterality: N/A;   COLONOSCOPY WITH PROPOFOL  N/A 09/16/2016   Procedure: COLONOSCOPY WITH PROPOFOL ;  Surgeon: Jinny Carmine, MD;  Location: [facility];  Service: Endoscopy;  Laterality: N/A;   COLONOSCOPY WITH PROPOFOL  N/A 10/07/2016   Procedure: COLONOSCOPY WITH PROPOFOL ;  Surgeon: Jinny Carmine, MD;  Location: [facility];  Service: Endoscopy;  Laterality: N/A;   COLONOSCOPY WITH PROPOFOL  N/A 09/29/2017   Procedure: COLONOSCOPY WITH PROPOFOL ;  Surgeon: Jinny Carmine, MD;  Location: [facility];  Service: Endoscopy;  Laterality: N/A;   COLONOSCOPY WITH PROPOFOL  N/A 11/04/2022   Procedure: COLONOSCOPY WITH PROPOFOL ;  Surgeon: Jinny Carmine, MD;  Location: [facility] ENDOSCOPY;  Service: Endoscopy;  Laterality: N/A;   DIAGNOSTIC LAPAROSCOPY     PLANTAR FASCIA RELEASE Left 10/08/2017   Procedure: PLANTAR FASCIA  RELEASE - WITH TOPAZ;  Surgeon: Lilli Cough, DPM;  Location: [facility];  Service: Podiatry;  Laterality: Left;  TOPAZ ENDOSCOPIC PLANTAR FASC SET LMA WITH EXPRELL   POLYPECTOMY  11/04/2022   Procedure: POLYPECTOMY;  Surgeon: Jinny Carmine, MD;  Location: [facility] END[facility];  Service: Endoscopy;;   TUBAL LIGATION      Family History  Problem Relation Age of Onset   ALS Mother    Dementia Mother    Heart disease Father    Hypertension Father    Diabetes Father    Gout Father    Heart disease Sister    Non-Hodgkin's lymphoma Brother    Other Brother        Brain Tumor   Breast cancer Paternal Grandmother 81    Social History   Socioeconomic History   Marital status: Married    Spouse name: Not on file   Number of children: Not on file   Years of education: Not on file   Highest education level: 12th grade  Occupational History   Not on file  Tobacco Use   Smoking status: Never   Smokeless tobacco: Never  Vaping Use   Vaping status: Never Used  Substance and Sexual Activity   Alcohol use: No   Drug use: No   Sexual activity: Not Currently    Birth control/protection: Post-menopausal  Other Topics Concern   Not on file  Social History Narrative   Not on file   Social Drivers of Health  Financial Resource Strain: Low Risk  (12/18/2022)   Overall Financial Resource Strain (CARDIA)    Difficulty of Paying Living Expenses: Not hard at all  Food Insecurity: No Food Insecurity (12/18/2022)   Hunger Vital Sign    Worried About Running Out of Food in the Last Year: Never true    Ran Out of Food in the Last Year: Never true  Transportation Needs: No Transportation Needs (12/18/2022)   PRAPARE - Administrator, Civil Service (Medical): No    Lack of Transportation (Non-Medical): No  Physical Activity: Sufficiently Active (12/18/2022)   Exercise Vital Sign    Days of Exercise per Week: 3 days    Minutes of Exercise per Session: 150+ min  Recent  Concern: Physical Activity - Insufficiently Active (12/16/2022)   Exercise Vital Sign    Days of Exercise per Week: 3 days    Minutes of Exercise per Session: 20 min  Stress: No Stress Concern Present (12/18/2022)   [employer] of Occupational Health - Occupational Stress Questionnaire    Feeling of Stress : Only a little  Recent Concern: Stress - Stress Concern Present (12/16/2022)   [employer] of Occupational Health - Occupational Stress Questionnaire    Feeling of Stress : To some extent  Social Connections: Moderately Isolated (12/18/2022)   Social Connection and Isolation Panel    Frequency of Communication with Friends and Family: More than three times a week    Frequency of Social Gatherings with Friends and Family: Three times a week    Attends Religious Services: Never    Active Member of Clubs or Organizations: No    Attends Banker Meetings: Never    Marital Status: Married  Catering manager Violence: Not At Risk (12/18/2022)   Humiliation, Afraid, Rape, and Kick questionnaire    Fear of Current or Ex-Partner: No    Emotionally Abused: No    Physically Abused: No    Sexually Abused: No    Outpatient Medications Prior to Visit  Medication Sig Dispense Refill   albuterol  (VENTOLIN  HFA) 108 (90 Base) MCG/ACT inhaler Inhale 1-2 puffs into the lungs every 4 (four) hours as needed for wheezing or shortness of breath. 8 g 3   cholecalciferol (VITAMIN D3) 25 MCG (1000 UNIT) tablet Take 1,000 Units by mouth daily.     ranitidine (ZANTAC) 150 MG tablet Take 150 mg by mouth 2 (two) times daily.     polyethylene glycol-electrolytes (NULYTELY) 420 g solution Take by mouth. (Patient not taking: Reported on 10/13/2023)     No facility-administered medications prior to visit.      ROS:  Review of Systems  Constitutional:  Negative for fever.  Gastrointestinal:  Negative for blood in stool, constipation, diarrhea, nausea and vomiting.  Genitourinary:  Negative  for dyspareunia, dysuria, flank pain, frequency, hematuria, urgency, vaginal bleeding, vaginal discharge and vaginal pain.  Musculoskeletal:  Negative for back pain.  Skin:  Negative for rash.   BREAST: No symptoms   OBJECTIVE:   Vitals:  BP (!) 86/56   Pulse 67   Ht 5' 7 (1.702 m)   Wt 191 lb (86.6 kg)   LMP 05/14/2011 (Approximate)   BMI 29.91 kg/m   Physical Exam Vitals reviewed.  Constitutional:      Appearance: She is well-developed.  Pulmonary:     Effort: Pulmonary effort is normal.  Genitourinary:    Labia:        Right: No rash, tenderness or lesion.  Left: No rash, tenderness or lesion.      Comments: NEG VAGINAL EXAM; LESION RESOLVED AFTER TX Musculoskeletal:        General: Normal range of motion.     Cervical back: Normal range of motion.  Skin:    General: Skin is warm and dry.  Neurological:     General: No focal deficit present.     Mental Status: She is alert and oriented to person, place, and time.     Cranial Nerves: No cranial nerve deficit.  Psychiatric:        Mood and Affect: Mood normal.        Behavior: Behavior normal.        Thought Content: Thought content normal.        Judgment: Judgment normal.     Assessment/Plan: Vaginal lesion--neg exam, sx resolved after TCA tx. F/u prn.   Normal vaginal exam    Return if symptoms worsen or fail to improve.  Westin Knotts B. Aubriee Szeto, PA-C 10/13/2023 10:06 AM

## 2023-10-09 ENCOUNTER — Ambulatory Visit: Attending: Cardiovascular Disease

## 2023-10-09 DIAGNOSIS — R079 Chest pain, unspecified: Secondary | ICD-10-CM

## 2023-10-09 LAB — ECHOCARDIOGRAM COMPLETE
AR max vel: 2.18 cm2
AV Area VTI: 2.04 cm2
AV Area mean vel: 2.1 cm2
AV Mean grad: 4 mmHg
AV Peak grad: 7.3 mmHg
Ao pk vel: 1.35 m/s
Area-P 1/2: 2.66 cm2
S' Lateral: 3.11 cm

## 2023-10-13 ENCOUNTER — Encounter: Payer: Self-pay | Admitting: Obstetrics and Gynecology

## 2023-10-13 ENCOUNTER — Ambulatory Visit: Admitting: Obstetrics and Gynecology

## 2023-10-13 VITALS — BP 86/56 | HR 67 | Ht 67.0 in | Wt 191.0 lb

## 2023-10-13 DIAGNOSIS — N898 Other specified noninflammatory disorders of vagina: Secondary | ICD-10-CM

## 2023-10-13 DIAGNOSIS — Z01419 Encounter for gynecological examination (general) (routine) without abnormal findings: Secondary | ICD-10-CM

## 2023-10-13 NOTE — Patient Instructions (Signed)
 I value your feedback and you entrusting [place] with your care. If you get a [place] patient survey, I would appreciate you taking the time to let us know about your experience today. Thank you! ? ? ?

## 2023-10-19 ENCOUNTER — Encounter: Payer: Self-pay | Admitting: Emergency Medicine

## 2023-10-19 NOTE — Therapy (Signed)
 OUTPATIENT OCCUPATIONAL THERAPY ORTHO EVALUATION  Patient Name: Tracey Watkins MRN: [id number] DOB:April 27, 1970, 53 y.o., female Today's Date: 10/20/2023  PCP: Gareth PARAS. FNP REFERRING PROVIDER: Dr. Arlinda  END OF SESSION:  OT End of Session - 10/20/23 1434     Visit Number 1    Number of Visits 5    Date for Recertification  12/04/23    Authorization Type BCBS    OT Start Time 1434    OT Stop Time 1510    OT Time Calculation (min) 36 min    Equipment Utilized During Treatment Band-Aid    Activity Tolerance Patient tolerated treatment well;No increased pain;Patient limited by fatigue;Patient limited by pain    Behavior During Therapy [facility] for tasks assessed/performed          Past Medical History:  Diagnosis Date   Carpal tunnel syndrome    Left Wrist   Cholecystitis    Requiring Lap Chole (02/28/16)   Endometriosis    GERD (gastroesophageal reflux disease)    Past Surgical History:  Procedure Laterality Date   CHOLECYSTECTOMY N/A 02/27/2016   Procedure: LAPAROSCOPIC CHOLECYSTECTOMY;  Surgeon: Laneta JULIANNA Luna, MD;  Location: [facility];  Service: General;  Laterality: N/A;   COLONOSCOPY WITH PROPOFOL  N/A 09/16/2016   Procedure: COLONOSCOPY WITH PROPOFOL ;  Surgeon: Jinny Carmine, MD;  Location: [facility] END[facility];  Service: Endoscopy;  Laterality: N/A;   COLONOSCOPY WITH PROPOFOL  N/A 10/07/2016   Procedure: COLONOSCOPY WITH PROPOFOL ;  Surgeon: Jinny Carmine, MD;  Location: [facility];  Service: Endoscopy;  Laterality: N/A;   COLONOSCOPY WITH PROPOFOL  N/A 09/29/2017   Procedure: COLONOSCOPY WITH PROPOFOL ;  Surgeon: Jinny Carmine, MD;  Location: [facility];  Service: Endoscopy;  Laterality: N/A;   COLONOSCOPY WITH PROPOFOL  N/A 11/04/2022   Procedure: COLONOSCOPY WITH PROPOFOL ;  Surgeon: Jinny Carmine, MD;  Location: [facility] ENDOSCOPY;  Service: Endoscopy;  Laterality: N/A;   DIAGNOSTIC LAPAROSCOPY     PLANTAR FASCIA RELEASE Left 10/08/2017   Procedure: PLANTAR FASCIA RELEASE - WITH  TOPAZ;  Surgeon: Lilli Cough, DPM;  Location: [facility];  Service: Podiatry;  Laterality: Left;  [facility] ENDOSCOPIC PLANTAR FASC SET LMA WITH EXPRELL   POLYPECTOMY  11/04/2022   Procedure: POLYPECTOMY;  Surgeon: Jinny Carmine, MD;  Location: [facility] END[facility];  Service: Endoscopy;;   TUBAL LIGATION     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    ONSET DATE: Chronic  REFERRING DIAG: G56.02 (ICD-10-CM) - Carpal tunnel syndrome of left wrist   THERAPY DIAG:  Paresthesia of skin  Pain in left hand  Rationale for Evaluation and Treatment: Rehabilitation  SUBJECTIVE:   SUBJECTIVE STATEMENT: She states she has some numbness in her left hand thumb and index finger, mostly at night.  She also has some pain and catching sensation in her left hand middle finger in her palm.  These things have been slowly building up over time, and have been more and more irritating.  She did get an injection from the surgeon recently which she states helped these issues.  She is here  to help manage these things and learn about them.SABRA     PERTINENT HISTORY: Had injections to TF and CT on 10/07/23  PRECAUTIONS: None  RED FLAGS: None   WEIGHT BEARING RESTRICTIONS: No  PAIN:  Are you having pain? Yes: NPRS scale: 0/10 at rest, 3-4/10 numbness/pain in past week  Pain location: Left middle finger Pain description: Aching Aggravating factors: Tight fist Relieving factors: Rest  FALLS: Has patient fallen in last 6 months? No  PLOF: Independent  PATIENT GOALS:  To improve paresthesia and pain in the left hand  NEXT MD VISIT: As needed   OBJECTIVE: (All objective assessments below are from initial evaluation on: 10/20/23 unless otherwise specified.)   HAND DOMINANCE: Right   ADLs: Overall ADLs: States decreased ability to grab, and hold household objects, trouble sleeping at night, etc.   UPPER EXTREMITY ROM     Shoulder to Wrist AROM Right eval Left eval  Shoulder flexion    Shoulder abduction    Shoulder extension    Shoulder internal rotation    Shoulder external rotation    Elbow flexion    Elbow extension    Forearm supination  85  Forearm pronation   85  Wrist flexion 64 41  Wrist extension 64 56  Wrist ulnar deviation    Wrist radial deviation    Functional dart thrower's motion (F-DTM) in ulnar flexion    F-DTM in radial extension     (Blank rows = not tested)   Hand AROM Right eval Left eval  Full Fist Ability (or Gap to Distal Palmar Crease)  Full fist but painful to MF trigger  Thumb Opposition  (Kapandji Scale)     Thumb MCP (0-60)    Thumb IP (0-80)    Thumb Radial Abduction Span     Thumb Palmar Abduction Span     Index MCP (0-90)     Index PIP (0-100)     Index DIP (0-70)      Long MCP (0-90)      Long PIP (0-100)      Long DIP (0-70)      Ring MCP (0-90)      Ring PIP (0-100)      Ring DIP (0-70)      Little MCP (0-90)      Little PIP (0-100)      Little DIP (0-70)      (Blank rows = not tested)   HAND FUNCTION: Eval: Observed weakness in affected Lt hand.  Grip strength Right: 64 lbs, Left: 52 lbs   COORDINATION: Eval: No concerns or issues  SENSATION: Eval:  Light touch intact today, no complaints of numbness now during the daytime, she states nighttime paresthesia is improving as she wears her brace that the doctor gave her and after recent injection  EDEMA:   Eval: None  COGNITION: Eval: Overall cognitive status: WFL for evaluation today   OBSERVATIONS:   Eval: Positive Tinel at  carpal tunnel and pronator in the left arm, negative in the right arm.  Triggering palpated in the left middle finger MCP volarly.  Presents as left carpal tunnel syndrome and left middle finger trigger finger   TODAY'S TREATMENT:  Post-evaluation treatment:   She was given information on nerve paresthesia as well as stenosing tenosynovitis.  This includes prevention, causes, ways to improve and cope.  Some of this info is listed under patient instructions.  Additionally, she was shown how to use a Band-Aid to prevent triggering of the middle  finger.  This was very successful for her.  She was told she may need to use this for 4 or 6 weeks consecutively, and she should wear her nighttime wrist brace for 4 or 6 weeks consecutively as well.  They may even be helpful for long-term management.  Lastly, she was given the following home exercise program to help her nerves as well as her tendons due to stiffness through her wrist and hand.  She tolerated each worn well, states understanding, leaves without questions or pain.  Exercises - Median Nerve Flossing  - 2-3 x daily - 5-10 reps - Wrist Prayer Stretch  - 4 x daily - 3-5 reps - 15 sec hold - Wrist Flexion Stretch  - 4 x daily - 3-5 reps - 15 sec hold    PATIENT EDUCATION: Education details: See tx section above for details  Person educated: Patient Education method: Verbal Instruction, Teach back, Handouts  Education comprehension: States and demonstrates understanding   HOME EXERCISE PROGRAM: Access Code: [email or web address] URL: [email or web address] Date: 10/20/2023 Prepared by: Melvenia Ada    GOALS: Goals reviewed with patient? Yes   SHORT TERM GOALS: (STG required if POC>30 days) Target Date: 10/20/2023  Pt will demo/state understanding of initial HEP to improve pain levels and prerequisite motion. Goal status: Goal met   LONG TERM GOALS: Target Date: 12/04/2023  Pt will improve grip strength in left hand  from 52lbs to at least 60 lbs for functional use at home and in IADLs. Goal status: INITIAL  2.  Pt will improve A/ROM in left wrist flexion/extension from 41/56 to at least 60 degrees each, to have functional motion for tasks like reach and grasp.  Goal status: INITIAL  3.  Pt will decrease pain and paresthesia to 1-2/10 or better, on average, to have better sleep and occupational participation in daily roles. Goal status: INITIAL   ASSESSMENT:  CLINICAL IMPRESSION: Patient is a 53 y.o. female who was seen today for occupational therapy evaluation for left hand middle finger triggering and pain as well as paresthesia in the left hand thought to be carpal tunnel syndrome-these things affect her daily ability and quality of life.  She was given a home program and education today and should be successful at managing these things on her own.  If she has any questions or concerns, this plan of care will be left open for 6 weeks and she can return at any time as needed.  She is in agreement with this plan.  The patient will benefit from outpatient occupational therapy to decrease symptoms, improve functional upper extremity use, and increase quality of life.  PERFORMANCE DEFICITS: in functional skills including IADLs, sensation, ROM, strength, pain, fascial restrictions, body mechanics, endurance, decreased knowledge of precautions, and UE functional use, cognitive skills including problem solving and safety awareness, and psychosocial skills including coping strategies, environmental adaptation, habits, and routines and behaviors.   IMPAIRMENTS: are limiting patient from ADLs, IADLs, rest and sleep, and leisure.   COMORBIDITIES: may have co-morbidities  that affects occupational performance. Patient will benefit from skilled OT to address above impairments and improve overall function.  MODIFICATION OR ASSISTANCE TO COMPLETE EVALUATION: No modification of tasks or assist necessary to complete an  evaluation.  OT OCCUPATIONAL PROFILE AND HISTORY: Problem focused assessment: Including review of records relating to presenting problem.  CLINICAL DECISION MAKING: Moderate - several treatment options, min-mod task modification necessary  REHAB POTENTIAL: Excellent  EVALUATION COMPLEXITY: Low      PLAN:  OT  FREQUENCY: 1-2x/week  OT DURATION: 6 weeks through 12/04/23 and up to 5 total visits as needed   PLANNED INTERVENTIONS: 97535 self care/ADL training, 02889 therapeutic exercise, 97530 therapeutic activity, 97112 neuromuscular re-education, 97140 manual therapy, 97035 ultrasound, 97032 electrical stimulation (manual), 97760 Orthotic Initial, [id number] Orthotic/Prosthetic subsequent, compression bandaging, Dry needling, energy conservation, coping strategies training, and patient/family education  RECOMMENDED OTHER SERVICES: none now    CONSULTED AND AGREED WITH PLAN OF CARE: Patient  PLAN FOR NEXT SESSION:   Follow-up as needed over the next 6 weeks to check HEP and coping.  If she does not return in the next 6 weeks, she can be discharged assuming all goals are met   Melvenia Ada, OTR/L, CHT  10/20/2023, 5:48 PM

## 2023-10-20 ENCOUNTER — Encounter: Payer: Self-pay | Admitting: Rehabilitative and Restorative Service Providers"

## 2023-10-20 ENCOUNTER — Ambulatory Visit: Admitting: Rehabilitative and Restorative Service Providers"

## 2023-10-20 DIAGNOSIS — M79642 Pain in left hand: Secondary | ICD-10-CM

## 2023-10-20 DIAGNOSIS — R202 Paresthesia of skin: Secondary | ICD-10-CM | POA: Diagnosis not present

## 2023-10-20 NOTE — Patient Instructions (Signed)
 Management of hand/arm numbness or "paresthesia"  Peripheral nerves do heal and regenerate- IF you are good to them and change habits/routines.   How you will be successful:  Change habits/routines, prevent "kinking the hose" and any numbness Exercise program to "unstick" nerves, loosen tightness in body Protect with supportive bracing as helpful  Avoid:  Repetitive or BAD prolonged postures Avoid "kinks" at neck, shoulder, elbow, forearm and wrist!! Generally, keep hands DOWN at night. Arm above head = bad Try to keep elbows at 90* or straighter  Don't bend or extend wrists extremely  Have pillow support for head/neck in neutral position  Watch resting with palm down and wrist flexed ALL THE TIME Things that hurt or cause numbness Direct pressure on "nerve points" For example: sleeping on hands, pressure while typing, resting wrist on steering wheel, resting elbow on center console, etc.   Specifics:  Change your sleep habits! Start every day with a non-painful stretch routine and gentle nerve "gliding" assigned by your therapist.  Stretch again, twice that day.   It's smart to gently warmup your body and stretch before doing typically stressful activities (or before bed if your hands hurt/go numb in the night).  If you must do repetitive or stressful activities, or can't break your sleep habits, try some type of support (wrist braces in night, etc.). Using an extra pillow and putting your hand in the pillowcase could help, too. Don't hold your phone to long! Using handles with padding, padded gloves can help with compression or vibration. Take breaks for rest and recovery; don't "force" yourself to finish a job.   Keep hands covered and warm in the winter or cool, rainy weather.

## 2023-10-23 ENCOUNTER — Encounter: Admitting: Rehabilitative and Restorative Service Providers"

## 2023-10-27 ENCOUNTER — Ambulatory Visit
Admission: RE | Admit: 2023-10-27 | Discharge: 2023-10-27 | Disposition: A | Discharge status: Home / Self Care | Source: Ambulatory Visit | Attending: Obstetrics and Gynecology | Admitting: Obstetrics and Gynecology

## 2023-10-27 DIAGNOSIS — Z1231 Encounter for screening mammogram for malignant neoplasm of breast: Secondary | ICD-10-CM | POA: Insufficient documentation

## 2023-11-16 ENCOUNTER — Encounter: Payer: Self-pay | Admitting: Radiology

## 2023-11-17 NOTE — Progress Notes (Unsigned)
 Tracey Watkins - 53 y.o. female MRN [id number]  Date of birth: 1970/06/13  Office Visit Note: Visit Date: 11/18/2023 PCP: Gareth Mliss FALCON, FNP Referred by: Gareth Mliss FALCON, FNP  Subjective: No chief complaint on file.  HPI: Tracey Watkins is a pleasant 53 y.o. female who returns today for follow-up regarding left wrist carpal tunnel syndrome and long finger trigger digit. Symptoms have been present for about a year with minimal improvement.  She underwent cortisone injection to the left carpal tunnel and long finger trigger digit approximately 6 weeks prior.  She has had an EMG which shows mild left median nerve entrapment.   She states that the recent injection to the carpal tunnel and long finger trigger digit gave her excellent relief.  Numbness and tingling has improved drastically, she is also utilizing bracing for nocturnal symptoms which is responding well.  No residual clicking or locking of the long finger currently.  Pertinent ROS were reviewed with the patient and found to be negative unless otherwise specified above in HPI.   Assessment & Plan: Visit Diagnoses:  1. Carpal tunnel syndrome of left wrist   2. Trigger finger, left middle finger      Plan: She is doing very well after injections to the carpal tunnel and long finger trigger digit A1 pulley.  We once again reviewed the etiology and pathophysiology of these conditions.  We discussed that should symptoms recur or remain refractory to conservative care we could consider repeat injections versus potential carpal tunnel release and trigger digit release as needed.  She expressed understanding, will return as needed moving forward.  She can continue utilizing the wrist brace for nocturnal symptoms as needed as well.   Follow-up: No follow-ups on file.   Meds & Orders: No orders of the defined types were placed in this encounter.  No orders of the defined types were placed in this encounter.    Procedures: No procedures  performed      Clinical History: No specialty comments available.  She reports that she has never smoked. She has never used smokeless tobacco.  Recent Labs    12/18/22 1039  HGBA1C 5.5    Objective:   Vital Signs: LMP 05/14/2011 (Approximate)   Physical Exam  Gen: Well-appearing, in no acute distress; non-toxic CV: Regular Rate. Well-perfused. Warm.  Resp: Breathing unlabored on room air; no wheezing. Psych: Fluid speech in conversation; appropriate affect; normal thought process  Ortho Exam PHYSICAL EXAM:  General: Patient is well appearing and in no distress.   Skin and Muscle: No significant skin changes are apparent to upper extremities.   Range of Motion and Palpation Tests: Mobility is full about the elbows with flexion and extension. Forearm supination and pronation are 85/85 bilaterally.  Wrist flexion/extension is 75/65 bilaterally.  Digital flexion and extension are full.  Thumb opposition is full to the base of the small fingers bilaterally.    Left long finger without significant nodule A1 pulley region, no associated tenderness.  Appropriate gliding of the digit today without clicking or locking.   Neurologic, Vascular, Motor: Sensation remains slightly diminished to light touch in the left median nerve distribution.    Thenar atrophy: Negative Tinel sign: Negative Carpal tunnel compression: Negative left Phalen test: Negative left   Motor left hand FPL: 5/5 Index FDP: 5/5 APB: 5/5  Fingers pink and well perfused.  Capillary refill is brisk.     Lab Results  Component Value Date   HGBA1C 5.5 12/18/2022  Imaging: No results found.  Past Medical/Family/Surgical/Social History: Medications & Allergies reviewed per EMR, new medications updated. Patient Active Problem List   Diagnosis Date Noted   Trigger finger, left middle finger 09/07/2023   Chest pain of uncertain etiology 08/29/2023   Polyp of ascending colon 11/04/2022   History  of colonic polyps    Adenoma determined by colorectal biopsy 11/18/2016   Colon cancer screening    Benign neoplasm of cecum    Screen for colon cancer    S/P laparoscopic cholecystectomy 02/29/2016   Allergic reaction 02/29/2016   Carpal tunnel syndrome of left wrist 10/03/2014   Past Medical History:  Diagnosis Date   Carpal tunnel syndrome    Left Wrist   Cholecystitis    Requiring Lap Chole (02/28/16)   Endometriosis    GERD (gastroesophageal reflux disease)    Family History  Problem Relation Age of Onset   ALS Mother    Dementia Mother    Heart disease Father    Hypertension Father    Diabetes Father    Gout Father    Heart disease Sister    Non-Hodgkin's lymphoma Brother    Other Brother        Brain Tumor   Breast cancer Paternal Grandmother 50   Past Surgical History:  Procedure Laterality Date   CHOLECYSTECTOMY N/A 02/27/2016   Procedure: LAPAROSCOPIC CHOLECYSTECTOMY;  Surgeon: Laneta JULIANNA Luna, MD;  Location: [facility];  Service: General;  Laterality: N/A;   COLONOSCOPY WITH PROPOFOL  N/A 09/16/2016   Procedure: COLONOSCOPY WITH PROPOFOL ;  Surgeon: Jinny Carmine, MD;  Location: [facility] END[facility]COPY;  Service: Endoscopy;  Laterality: N/A;   COLONOSCOPY WITH PROPOFOL  N/A 10/07/2016   Procedure: COLONOSCOPY WITH PROPOFOL ;  Surgeon: Jinny Carmine, MD;  Location: [facility];  Service: Endoscopy;  Laterality: N/A;   COLONOSCOPY WITH PROPOFOL  N/A 09/29/2017   Procedure: COLONOSCOPY WITH PROPOFOL ;  Surgeon: Jinny Carmine, MD;  Location: [facility] ENDOSCOPY;  Service: Endoscopy;  Laterality: N/A;   COLONOSCOPY WITH PROPOFOL  N/A 11/04/2022   Procedure: COLONOSCOPY WITH PROPOFOL ;  Surgeon: Jinny Carmine, MD;  Location: [facility] ENDOS[facility];  Service: Endoscopy;  Laterality: N/A;   DIAGNOSTIC LAPAROSCOPY     PLANTAR FASCIA RELEASE Left 10/08/2017   Procedure: PLANTAR FASCIA RELEASE - WITH TOPAZ;  Surgeon: Lilli Cough, DPM;  Location: [facility];  Service: Podiatry;  Laterality: Left;   TOPAZ ENDOSCOPIC PLANTAR FASC SET LMA WITH EXPRELL   POLYPECTOMY  11/04/2022   Procedure: POLYPECTOMY;  Surgeon: Jinny Carmine, MD;  Location: [facility] ENDOSCOPY;  Service: Endoscopy;;   TUBAL LIGATION     Social History   Occupational History   Not on file  Tobacco Use   Smoking status: Never   Smokeless tobacco: Never  Vaping Use   Vaping status: Never Used  Substance and Sexual Activity   Alcohol use: No   Drug use: No   Sexual activity: Not Currently    Birth control/protection: Post-menopausal    Laini Urick Afton Alderton, M.D. [facility], Hand Surgery

## 2023-11-18 ENCOUNTER — Ambulatory Visit (INDEPENDENT_AMBULATORY_CARE_PROVIDER_SITE_OTHER): Admitting: Orthopedic Surgery

## 2023-11-18 DIAGNOSIS — M65332 Trigger finger, left middle finger: Secondary | ICD-10-CM

## 2023-11-18 DIAGNOSIS — G5602 Carpal tunnel syndrome, left upper limb: Secondary | ICD-10-CM | POA: Diagnosis not present

## 2023-11-23 ENCOUNTER — Ambulatory Visit: Admitting: Emergency Medicine

## 2023-11-23 DIAGNOSIS — Z01810 Encounter for preprocedural cardiovascular examination: Secondary | ICD-10-CM | POA: Diagnosis not present

## 2023-11-23 NOTE — Progress Notes (Signed)
 Virtual Visit via Telephone Note   Because of LEVADA BOWERSOX co-morbid illnesses, she is at least at moderate risk for complications without adequate follow up.  This format is felt to be most appropriate for this patient at this time.  Due to technical limitations with video connection (technology), today's appointment will be conducted as an audio only telehealth visit, and AZARIYA FREEMAN verbally agreed to proceed in this manner.   All issues noted in this document were discussed and addressed.  No physical exam could be performed with this format.  Evaluation Performed:  Preoperative cardiovascular risk assessment _____________   Date:  11/23/2023   Patient ID:  Burnard JONELLE Gainer, DOB 1970/10/04, MRN [id number] Patient Location:  Home Provider location:   Office  Primary Care Provider:  Gareth Mliss FALCON, FNP Primary Cardiologist:  None  Chief Complaint / Patient Profile   53 y.o. y/o female with a h/o GERD, endometriosis, carpal tunnel syndrome who is pending colonoscopy on 12/23/2023 and presents today for telephonic preoperative cardiovascular risk assessment.  History of Present Illness    NYKOLE MATOS is a 53 y.o. female who presents via audio/video conferencing for a telehealth visit today.  Pt was last seen in cardiology clinic on 08/31/2023 by Dr. Timothy Gollan.  At that time RYVER ZADROZNY was stable, CT cardiac calcium scoring 0 on 09/20/2023. The patient is now pending procedure as outlined above. Since her last visit, chest pain, palpitations, dyspnea, orthopnea, n, v, dark/tarry/bloody stools, hematuria, dizziness, syncope, edema, weight gain.  Patient denies any further episodes similar to the one that she had earlier this year that brought her into see cardiology.   Past Medical History    Past Medical History:  Diagnosis Date   Carpal tunnel syndrome    Left Wrist   Cholecystitis    Requiring Lap Chole (02/28/16)   Endometriosis    GERD (gastroesophageal reflux disease)     Past Surgical History:  Procedure Laterality Date   CHOLECYSTECTOMY N/A 02/27/2016   Procedure: LAPAROSCOPIC CHOLECYSTECTOMY;  Surgeon: Laneta FALCON Luna, MD;  Location: [facility];  Service: General;  Laterality: N/A;   COLONOSCOPY WITH PROPOFOL  N/A 09/16/2016   Procedure: COLONOSCOPY WITH PROPOFOL ;  Surgeon: Jinny Carmine, MD;  Location: [facility] END[facility];  Service: Endoscopy;  Laterality: N/A;   COLONOSCOPY WITH PROPOFOL  N/A 10/07/2016   Procedure: COLONOSCOPY WITH PROPOFOL ;  Surgeon: Jinny Carmine, MD;  Location: [facility];  Service: Endoscopy;  Laterality: N/A;   COLONOSCOPY WITH PROPOFOL  N/A 09/29/2017   Procedure: COLONOSCOPY WITH PROPOFOL ;  Surgeon: Jinny Carmine, MD;  Location: [facility];  Service: Endoscopy;  Laterality: N/A;   COLONOSCOPY WITH PROPOFOL  N/A 11/04/2022   Procedure: COLONOSCOPY WITH PROPOFOL ;  Surgeon: Jinny Carmine, MD;  Location: [facility];  Service: Endoscopy;  Laterality: N/A;   DIAGNOSTIC LAPAROSCOPY     PLANTAR FASCIA RELEASE Left 10/08/2017   Procedure: PLANTAR FASCIA RELEASE - WITH TOPAZ;  Surgeon: Lilli Cough, DPM;  Location: [facility];  Service: Podiatry;  Laterality: Left;  [facility] ENDOSCOPIC PLANTAR FASC SET LMA WITH EXPRELL   POLYPECTOMY  11/04/2022   Procedure: POLYPECTOMY;  Surgeon: Jinny Carmine, MD;  Location: [facility] END[facility];  Service: Endoscopy;;   TUBAL LIGATION      Allergies  Allergies  Allergen Reactions   Skin Adhesives [Cyanoacrylate] Rash    Dermabond    Home Medications    Prior to Admission medications   Medication Sig Start Date End Date Taking? Authorizing Provider  albuterol  (VENTOLIN  HFA) 108 (90  Base) MCG/ACT inhaler Inhale 1-2 puffs into the lungs every 4 (four) hours as needed for wheezing or shortness of breath. 12/18/22   Pender, Julie F, FNP  cholecalciferol (VITAMIN D3) 25 MCG (1000 UNIT) tablet Take 1,000 Units by mouth daily.    [provider]  polyethylene glycol-electrolytes (NULYTELY) 420 g  solution Take by mouth. Patient not taking: Reported on 10/13/2023 09/30/23   [provider]  ranitidine (ZANTAC) 150 MG tablet Take 150 mg by mouth 2 (two) times daily.    [provider]    Physical Exam    Vital Signs:  JACOBA CHERNEY does not have vital signs available for review today.  Given telephonic nature of communication, physical exam is limited. AAOx3. NAD. Normal affect.  Speech and respirations are unlabored.  Accessory Clinical Findings    None  Assessment & Plan    1.  Preoperative Cardiovascular Risk Assessment:  According to the Revised Cardiac Risk Index (RCRI), her Perioperative Risk of Major Cardiac Event is (%): 0.4  Her Functional Capacity in METs is: 5.07 according to the Duke Activity Status Index (DASI). Therefore, based on ACC/AHA guidelines, patient would be at acceptable risk for the planned procedure without further cardiovascular testing.   Patient is not on any antiplatelet or oral anticoagulant therapies.  The patient was advised that if she develops new symptoms prior to surgery to contact our office to arrange for a follow-up visit, and she verbalized understanding.  A copy of this note will be routed to requesting surgeon.  Time:   Today, I have spent 5 minutes with the patient with telehealth technology discussing medical history, symptoms, and management plan.     Vanette Noguchi E Amberlee Garvey, NP  11/23/2023, 8:14 AM

## 2023-11-25 NOTE — Telephone Encounter (Signed)
 Assessment & Plan    1.  Preoperative Cardiovascular Risk Assessment:   According to the Revised Cardiac Risk Index (RCRI), her Perioperative Risk of Major Cardiac Event is (%): 0.4   Her Functional Capacity in METs is: 5.07 according to the Duke Activity Status Index (DASI). Therefore, based on ACC/AHA guidelines, patient would be at acceptable risk for the planned procedure without further cardiovascular testing.    Patient is not on any antiplatelet or oral anticoagulant therapies.   The patient was advised that if she develops new symptoms prior to surgery to contact our office to arrange for a follow-up visit, and she verbalized understanding.   A copy of this note will be routed to requesting surgeon

## 2023-12-21 ENCOUNTER — Telehealth: Payer: Self-pay

## 2023-12-21 ENCOUNTER — Encounter: Payer: Self-pay | Admitting: Gastroenterology

## 2023-12-21 NOTE — Telephone Encounter (Signed)
 Reminder call placed to patient to remind her of her colonoscopy scheduled at [facility] on 12/23/23.  Asked her to call office if she has any questions as she is preparing.  Thanks,  [place], CMA

## 2023-12-22 NOTE — H&P (Signed)
 Tracey Schaffer, MD [facility] [address], [address] [place], [place] [address] Phone:[phone] Fax : [phone]  Primary Care Physician:  Gareth Mliss FALCON, FNP Primary Gastroenterologist:  Dr. Schaffer  Pre-Procedure History & Physical: HPI:  Tracey Watkins is a 53 y.o. female is here for an colonoscopy.  Prior procedure? Fhx CRC? Blood thinners?   Past Medical History:  Diagnosis Date   Carpal tunnel syndrome    Left Wrist   Cholecystitis    Requiring Lap Chole (02/28/16)   Endometriosis    GERD (gastroesophageal reflux disease)     Past Surgical History:  Procedure Laterality Date   CHOLECYSTECTOMY N/A 02/27/2016   Procedure: LAPAROSCOPIC CHOLECYSTECTOMY;  Surgeon: Laneta FALCON Luna, MD;  Location: [facility];  Service: General;  Laterality: N/A;   COLONOSCOPY WITH PROPOFOL  N/A 09/16/2016   Procedure: COLONOSCOPY WITH PROPOFOL ;  Surgeon: Jinny Carmine, MD;  Location: [facility];  Service: Endoscopy;  Laterality: N/A;   COLONOSCOPY WITH PROPOFOL  N/A 10/07/2016   Procedure: COLONOSCOPY WITH PROPOFOL ;  Surgeon: Jinny Carmine, MD;  Location: [facility];  Service: Endoscopy;  Laterality: N/A;   COLONOSCOPY WITH PROPOFOL  N/A 09/29/2017   Procedure: COLONOSCOPY WITH PROPOFOL ;  Surgeon: Jinny Carmine, MD;  Location: [facility];  Service: Endoscopy;  Laterality: N/A;   COLONOSCOPY WITH PROPOFOL  N/A 11/04/2022   Procedure: COLONOSCOPY WITH PROPOFOL ;  Surgeon: Jinny Carmine, MD;  Location: [facility];  Service: Endoscopy;  Laterality: N/A;   DIAGNOSTIC LAPAROSCOPY     PLANTAR FASCIA RELEASE Left 10/08/2017   Procedure: PLANTAR FASCIA RELEASE - WITH TOPAZ;  Surgeon: Lilli Cough, DPM;  Location: [facility];  Service: Podiatry;  Laterality: Left;  [facility] ENDOSCOPIC PLANTAR FASC SET LMA WITH EXPRELL   POLYPECTOMY  11/04/2022   Procedure: POLYPECTOMY;  Surgeon: Jinny Carmine, MD;  Location: [facility];  Service: Endoscopy;;   TUBAL LIGATION      Prior to Admission  medications   Medication Sig Start Date End Date Taking? Authorizing Provider  cholecalciferol (VITAMIN D3) 25 MCG (1000 UNIT) tablet Take 1,000 Units by mouth daily.   Yes [provider]  ranitidine (ZANTAC) 150 MG tablet Take 150 mg by mouth 2 (two) times daily.   Yes [provider]  albuterol  (VENTOLIN  HFA) 108 (90 Base) MCG/ACT inhaler Inhale 1-2 puffs into the lungs every 4 (four) hours as needed for wheezing or shortness of breath. Patient not taking: Reported on 12/21/2023 12/18/22   Gareth Mliss FALCON, FNP  polyethylene glycol-electrolytes (NULYTELY) 420 g solution Take by mouth. Patient not taking: Reported on 10/13/2023 09/30/23   [provider]    Allergies as of 09/30/2023 - Review Complete 09/30/2023  Allergen Reaction Noted   Skin adhesives [cyanoacrylate] Rash 02/29/2016    Family History  Problem Relation Age of Onset   ALS Mother    Dementia Mother    Heart disease Father    Hypertension Father    Diabetes Father    Gout Father    Heart disease Sister    Non-Hodgkin's lymphoma Brother    Other Brother        Brain Tumor   Breast cancer Paternal Grandmother 9    Social History   Socioeconomic History   Marital status: Married    Spouse name: Not on file   Number of children: Not on file   Years of education: Not on file   Highest education level: 12th grade  Occupational History   Not on file  Tobacco Use   Smoking status: Never  Smokeless tobacco: Never  Vaping Use   Vaping status: Never Used  Substance and Sexual Activity   Alcohol use: No   Drug use: No   Sexual activity: Not Currently    Birth control/protection: Post-menopausal  Other Topics Concern   Not on file  Social History Narrative   Not on file   Social Drivers of Health   Financial Resource Strain: Low Risk  (12/18/2022)   Overall Financial Resource Strain (CARDIA)    Difficulty of Paying Living Expenses: Not hard at all  Food Insecurity: No Food  Insecurity (12/18/2022)   Hunger Vital Sign    Worried About Running Out of Food in the Last Year: Never true    Ran Out of Food in the Last Year: Never true  Transportation Needs: No Transportation Needs (12/18/2022)   PRAPARE - Administrator, Civil Service (Medical): No    Lack of Transportation (Non-Medical): No  Physical Activity: Sufficiently Active (12/18/2022)   Exercise Vital Sign    Days of Exercise per Week: 3 days    Minutes of Exercise per Session: 150+ min  Recent Concern: Physical Activity - Insufficiently Active (12/16/2022)   Exercise Vital Sign    Days of Exercise per Week: 3 days    Minutes of Exercise per Session: 20 min  Stress: No Stress Concern Present (12/18/2022)   [employer] of Occupational Health - Occupational Stress Questionnaire    Feeling of Stress : Only a little  Recent Concern: Stress - Stress Concern Present (12/16/2022)   [employer] of Occupational Health - Occupational Stress Questionnaire    Feeling of Stress : To some extent  Social Connections: Moderately Isolated (12/18/2022)   Social Connection and Isolation Panel    Frequency of Communication with Friends and Family: More than three times a week    Frequency of Social Gatherings with Friends and Family: Three times a week    Attends Religious Services: Never    Active Member of Clubs or Organizations: No    Attends Banker Meetings: Never    Marital Status: Married  Catering Manager Violence: Not At Risk (12/18/2022)   Humiliation, Afraid, Rape, and Kick questionnaire    Fear of Current or Ex-Partner: No    Emotionally Abused: No    Physically Abused: No    Sexually Abused: No    Review of Systems: See HPI, otherwise negative ROS  Physical Exam: Wt 83.5 kg   LMP 05/14/2011 (Approximate)   BMI 28.82 kg/m  General:   Alert,  pleasant and cooperative in NAD Head:  Normocephalic and atraumatic. Neck:  Supple; no masses or thyromegaly. Lungs:  Clear  throughout to auscultation.    Heart:  Regular rate and rhythm. Abdomen:  Soft, nontender and nondistended. Normal bowel sounds, without guarding, and without rebound.   Neurologic:  Alert and  oriented x4;  grossly normal neurologically.  Impression/Plan: Tracey Watkins is here for an colonoscopy to be performed for personal history TV adenoma in the cecum in 2018, followed up in 2019 and AP in 2024.  Risks, benefits, limitations, and alternatives regarding  colonoscopy have been reviewed with the patient.  Questions have been answered.  All parties agreeable.   Tracey Tracey HERO, MD  12/22/2023, 1:01 PM

## 2023-12-23 ENCOUNTER — Other Ambulatory Visit: Payer: Self-pay

## 2023-12-23 ENCOUNTER — Ambulatory Visit: Payer: Self-pay | Admitting: Anesthesiology

## 2023-12-23 ENCOUNTER — Encounter: Payer: Self-pay | Admitting: Gastroenterology

## 2023-12-23 ENCOUNTER — Ambulatory Visit
Admission: RE | Admit: 2023-12-23 | Discharge: 2023-12-23 | Disposition: A | Discharge status: Home / Self Care | Attending: Gastroenterology | Admitting: Gastroenterology

## 2023-12-23 ENCOUNTER — Encounter: Admission: RE | Disposition: A | Payer: Self-pay | Source: Home / Self Care | Attending: Gastroenterology

## 2023-12-23 DIAGNOSIS — Z1211 Encounter for screening for malignant neoplasm of colon: Secondary | ICD-10-CM | POA: Diagnosis not present

## 2023-12-23 DIAGNOSIS — K635 Polyp of colon: Secondary | ICD-10-CM | POA: Diagnosis not present

## 2023-12-23 DIAGNOSIS — K5730 Diverticulosis of large intestine without perforation or abscess without bleeding: Secondary | ICD-10-CM | POA: Diagnosis not present

## 2023-12-23 DIAGNOSIS — Z860101 Personal history of adenomatous and serrated colon polyps: Secondary | ICD-10-CM

## 2023-12-23 DIAGNOSIS — N809 Endometriosis, unspecified: Secondary | ICD-10-CM | POA: Diagnosis not present

## 2023-12-23 DIAGNOSIS — K640 First degree hemorrhoids: Secondary | ICD-10-CM

## 2023-12-23 DIAGNOSIS — K644 Residual hemorrhoidal skin tags: Secondary | ICD-10-CM

## 2023-12-23 DIAGNOSIS — K219 Gastro-esophageal reflux disease without esophagitis: Secondary | ICD-10-CM | POA: Diagnosis not present

## 2023-12-23 DIAGNOSIS — Z860100 Personal history of colon polyps, unspecified: Secondary | ICD-10-CM

## 2023-12-23 DIAGNOSIS — Z79899 Other long term (current) drug therapy: Secondary | ICD-10-CM | POA: Diagnosis not present

## 2023-12-23 HISTORY — PX: COLONOSCOPY: SHX5424

## 2023-12-23 HISTORY — PX: POLYPECTOMY: SHX149

## 2023-12-23 SURGERY — COLONOSCOPY
Anesthesia: General | Site: Rectum

## 2023-12-23 MED ORDER — PROPOFOL 1000 MG/100ML IV EMUL
INTRAVENOUS | Status: AC
  Filled 2023-12-23: qty 100

## 2023-12-23 MED ORDER — STERILE WATER FOR IRRIGATION IR SOLN
Status: DC | PRN
  Administered 2023-12-23: 1

## 2023-12-23 MED ORDER — LACTATED RINGERS IV SOLN: INTRAVENOUS | Status: DC

## 2023-12-23 MED ORDER — SODIUM CHLORIDE 0.9 % IV SOLN: INTRAVENOUS | Status: DC

## 2023-12-23 MED ORDER — ONDANSETRON HCL 4 MG/2ML IJ SOLN: 4.0000 mg | Freq: Once | INTRAMUSCULAR | Status: DC | PRN

## 2023-12-23 MED ORDER — PROPOFOL 10 MG/ML IV BOLUS
INTRAVENOUS | Status: DC | PRN
  Administered 2023-12-23: 60 mg via INTRAVENOUS
  Administered 2023-12-23: 120 ug/kg/min via INTRAVENOUS
  Administered 2023-12-23: 80 mg via INTRAVENOUS

## 2023-12-23 SURGICAL SUPPLY — 7 items
GOWN CVR UNV OPN BCK APRN NK (MISCELLANEOUS) ×2 IMPLANT
KIT PROCEDURE OLYMPUS (MISCELLANEOUS) ×1 IMPLANT
MANIFOLD NEPTUNE II (INSTRUMENTS) ×1 IMPLANT
SNARE COLD EXACTO (MISCELLANEOUS) IMPLANT
STRAP BODY AND KNEE 60X3 (MISCELLANEOUS) IMPLANT
TRAP ETRAP POLY (MISCELLANEOUS) IMPLANT
WATER STERILE IRR 250ML POUR (IV SOLUTION) ×1 IMPLANT

## 2023-12-23 NOTE — Op Note (Signed)
 [facility] Patient Name: Tracey Watkins Procedure Date: 12/23/2023 9:37 AM MRN: [id number] Account #: [id number] Date of Birth: 14-Sep-1970 Admit Type: Outpatient Age: 53 Room: [facility] OR ROOM 01 Gender: Female Note Status: Finalized Instrument Name: Colonoscope [id number] Procedure:             Colonoscopy Indications:           High risk colon cancer surveillance: Personal history                         of colonic polyps Providers:             Clotilda Schaffer, MD Referring MD:          Mliss FALCON. Pender (Referring MD) Medicines:             Propofol  per Anesthesia Complications:         No immediate complications. Estimated blood loss: None. Procedure:             Pre-Anesthesia Assessment:                        - Prior to the procedure, a History and Physical was                         performed, and patient medications and allergies were                         reviewed. The patient's tolerance of previous                         anesthesia was also reviewed. The risks and benefits                         of the procedure and the sedation options and risks                         were discussed with the patient. All questions were                         answered, and informed consent was obtained. Prior                         Anticoagulants: The patient has taken no anticoagulant                         or antiplatelet agents. ASA Grade Assessment: II - A                         patient with mild systemic disease. After reviewing                         the risks and benefits, the patient was deemed in                         satisfactory condition to undergo the procedure.                        After obtaining informed consent, the colonoscope was  passed under direct vision. Throughout the procedure,                         the patient's blood pressure, pulse, and oxygen                          saturations were monitored  continuously. The                         Colonoscope was introduced through the anus and                         advanced to the 10 cm into the ileum. The terminal                         ileum, ileocecal valve, appendiceal orifice, and                         rectum were photographed. The quality of the bowel                         preparation was good. Findings:      A 5 mm polyp was found in the transverse colon. The polyp was sessile.       The polyp was removed with a cold snare. Resection and retrieval were       complete.      A few medium-mouthed diverticula were found in the sigmoid colon.      Non-bleeding external and internal hemorrhoids were found. The       hemorrhoids were Grade I (internal hemorrhoids that do not prolapse).      The terminal ileum appeared normal. Impression:            - One 5 mm polyp in the transverse colon, removed with                         a cold snare. Resected and retrieved.                        - Diverticulosis in the sigmoid colon.                        - Non-bleeding external and internal hemorrhoids.                        - The examined portion of the ileum was normal. Recommendation:        - Repeat colonoscopy in 5 years for surveillance based                         on your personal history of colon polyps.                        - High fiber diet. Procedure Code(s):     --- Professional ---                        [phone], Colonoscopy, flexible; with removal of  tumor(s), polyp(s), or other lesion(s) by snare                         technique Diagnosis Code(s):     --- Professional ---                        Z86.010, Personal history of colonic polyps                        D12.3, Benign neoplasm of transverse colon (hepatic                         flexure or splenic flexure)                        K64.0, First degree hemorrhoids                        K57.30, Diverticulosis of large intestine without                          perforation or abscess without bleeding CPT copyright 2022 American Medical Association. All rights reserved. The codes documented in this report are preliminary and upon coder review may  be revised to meet current compliance requirements. Clotilda Schaffer, MD 12/23/2023 10:11:34 AM Number of Addenda: 0 Note Initiated On: 12/23/2023 9:37 AM Scope Withdrawal Time: 0 hours 14 minutes 11 seconds  Total Procedure Duration: 0 hours 17 minutes 48 seconds  Estimated Blood Loss:  Estimated blood loss: none.      [facility]

## 2023-12-23 NOTE — Anesthesia Postprocedure Evaluation (Signed)
 Anesthesia Post Note  Patient: Tracey Watkins  Procedure(s) Performed: COLONOSCOPY WITH BIOPSY (Rectum) POLYPECTOMY, INTESTINE (Rectum)  Patient location during evaluation: PACU Anesthesia Type: General Level of consciousness: awake and alert, oriented and patient cooperative Pain management: pain level controlled Vital Signs Assessment: post-procedure vital signs reviewed and stable Respiratory status: spontaneous breathing, nonlabored ventilation and respiratory function stable Cardiovascular status: blood pressure returned to baseline and stable Postop Assessment: adequate PO intake Anesthetic complications: no   No notable events documented.   Last Vitals:  Vitals:   12/23/23 1006 12/23/23 1013  BP: 113/71 101/73  Pulse: 74 77  Resp: 14 19  Temp: 36.6 C   SpO2: 99% 99%    Last Pain:  Vitals:   12/23/23 1013  TempSrc:   PainSc: 0-No pain                 Alfonso Ruths

## 2023-12-23 NOTE — Transfer of Care (Signed)
 Immediate Anesthesia Transfer of Care Note  Patient: Tracey Watkins  Procedure(s) Performed: COLONOSCOPY WITH BIOPSY (Rectum) POLYPECTOMY, INTESTINE (Rectum)  Patient Location: PACU  Anesthesia Type: General  Level of Consciousness: awake, alert  and patient cooperative  Airway and Oxygen  Therapy: Patient Spontanous Breathing and Patient connected to supplemental oxygen   Post-op Assessment: Post-op Vital signs reviewed, Patient's Cardiovascular Status Stable, Respiratory Function Stable, Patent Airway and No signs of Nausea or vomiting  Post-op Vital Signs: Reviewed and stable  Complications: No notable events documented.

## 2023-12-23 NOTE — Anesthesia Preprocedure Evaluation (Addendum)
 Anesthesia Evaluation  Patient identified by MRN, date of birth, ID band Patient awake    Reviewed: Allergy & Precautions, NPO status , Patient's Chart, lab work & pertinent test results  History of Anesthesia Complications Negative for: history of anesthetic complications  Airway Mallampati: II   Neck ROM: Full    Dental no notable dental hx.    Pulmonary neg pulmonary ROS   Pulmonary exam normal breath sounds clear to auscultation       Cardiovascular Exercise Tolerance: Good negative cardio ROS Normal cardiovascular exam Rhythm:Regular Rate:Normal  ECG 08/31/23: normal   Neuro/Psych negative neurological ROS     GI/Hepatic ,GERD  ,,  Endo/Other  negative endocrine ROS    Renal/GU negative Renal ROS     Musculoskeletal   Abdominal   Peds  Hematology negative hematology ROS (+)   Anesthesia Other Findings Cardiology note 08/31/23:  Chest pain Etiology unclear, few risk factors for cardiac disease Non-smoker, no diabetes, cholesterol is not high Given family history she is interested in screening study CT calcium scoring ordered at her request Echocardiogram ordered-given severe chest pain episode in effort to rule out structural heart disease -Recommend she call us  for any recurrent episodes - We will call her with the results from testing above - No medications added at today's visit   Reproductive/Obstetrics Endometriosis                               Anesthesia Physical Anesthesia Plan  ASA: 2  Anesthesia Plan: General   Post-op Pain Management:    Induction: Intravenous  PONV Risk Score and Plan: 3 and Propofol  infusion, TIVA and Treatment may vary due to age or medical condition  Airway Management Planned: Natural Airway  Additional Equipment:   Intra-op Plan:   Post-operative Plan:   Informed Consent: I have reviewed the patients History and Physical, chart,  labs and discussed the procedure including the risks, benefits and alternatives for the proposed anesthesia with the patient or authorized representative who has indicated his/her understanding and acceptance.       Plan Discussed with: CRNA  Anesthesia Plan Comments: (LMA/GETA backup discussed.  Patient consented for risks of anesthesia including but not limited to:  - adverse reactions to medications - damage to eyes, teeth, lips or other oral mucosa - nerve damage due to positioning  - sore throat or hoarseness - damage to heart, brain, nerves, lungs, other parts of body or loss of life  Informed patient about role of CRNA in peri- and intra-operative care.  Patient voiced understanding.)         Anesthesia Quick Evaluation

## 2023-12-28 ENCOUNTER — Ambulatory Visit: Payer: Self-pay | Admitting: Gastroenterology

## 2023-12-28 LAB — SURGICAL PATHOLOGY
